# Patient Record
Sex: Female | Born: 1950 | Race: Black or African American | Hispanic: No | Marital: Married | Smoking: Never smoker
Health system: Southern US, Community
[De-identification: ages and names within clinical notes are randomized; demographics above are authoritative.]

## PROBLEM LIST (undated history)

## (undated) DIAGNOSIS — I10 Essential (primary) hypertension: Secondary | ICD-10-CM

## (undated) DIAGNOSIS — R7302 Impaired glucose tolerance (oral): Secondary | ICD-10-CM

## (undated) DIAGNOSIS — E785 Hyperlipidemia, unspecified: Secondary | ICD-10-CM

## (undated) DIAGNOSIS — K219 Gastro-esophageal reflux disease without esophagitis: Secondary | ICD-10-CM

## (undated) DIAGNOSIS — E119 Type 2 diabetes mellitus without complications: Secondary | ICD-10-CM

## (undated) DIAGNOSIS — E7800 Pure hypercholesterolemia, unspecified: Secondary | ICD-10-CM

## (undated) DIAGNOSIS — M810 Age-related osteoporosis without current pathological fracture: Secondary | ICD-10-CM

## (undated) HISTORY — DX: Essential (primary) hypertension: I10

## (undated) HISTORY — DX: Type 2 diabetes mellitus without complications: E11.9

## (undated) HISTORY — DX: Age-related osteoporosis without current pathological fracture: M81.0

## (undated) HISTORY — DX: Impaired glucose tolerance (oral): R73.02

## (undated) HISTORY — PX: BREAST EXCISIONAL BIOPSY: SUR124

## (undated) HISTORY — PX: BREAST SURGERY: SHX581

## (undated) HISTORY — DX: Hyperlipidemia, unspecified: E78.5

## (undated) HISTORY — PX: ABDOMINAL HYSTERECTOMY: SHX81

## (undated) HISTORY — PX: CYSTECTOMY: SUR359

---

## 2000-06-29 ENCOUNTER — Encounter: Payer: Self-pay | Admitting: Family Medicine

## 2000-06-29 ENCOUNTER — Encounter: Admission: RE | Admit: 2000-06-29 | Discharge: 2000-06-29 | Payer: Self-pay | Admitting: Family Medicine

## 2002-11-14 ENCOUNTER — Encounter: Admission: RE | Admit: 2002-11-14 | Discharge: 2002-11-14 | Payer: Self-pay | Admitting: Internal Medicine

## 2002-11-14 ENCOUNTER — Encounter: Payer: Self-pay | Admitting: Internal Medicine

## 2003-07-14 ENCOUNTER — Encounter: Admission: RE | Admit: 2003-07-14 | Discharge: 2003-07-14 | Payer: Self-pay | Admitting: Internal Medicine

## 2003-07-14 ENCOUNTER — Encounter: Payer: Self-pay | Admitting: Internal Medicine

## 2004-11-02 ENCOUNTER — Encounter: Admission: RE | Admit: 2004-11-02 | Discharge: 2004-11-02 | Payer: Self-pay | Admitting: Internal Medicine

## 2005-03-10 ENCOUNTER — Ambulatory Visit: Payer: Self-pay

## 2006-02-22 ENCOUNTER — Encounter: Admission: RE | Admit: 2006-02-22 | Discharge: 2006-02-22 | Payer: Self-pay | Admitting: Internal Medicine

## 2006-02-22 IMAGING — MG MM SCREEN MAMMOGRAM BILATERAL
5 series · 5 of 5 positions shown · non-contrast
Comparison: none

DG SCREEN MAMMOGRAM BILATERAL
Bilateral CC and MLO view(s) were taken.

SCREENING MAMMOGRAM:
There is a  dense fibroglandular pattern.  No masses or malignant type calcifications are 
identified.  Compared with prior studies.

[R CC]
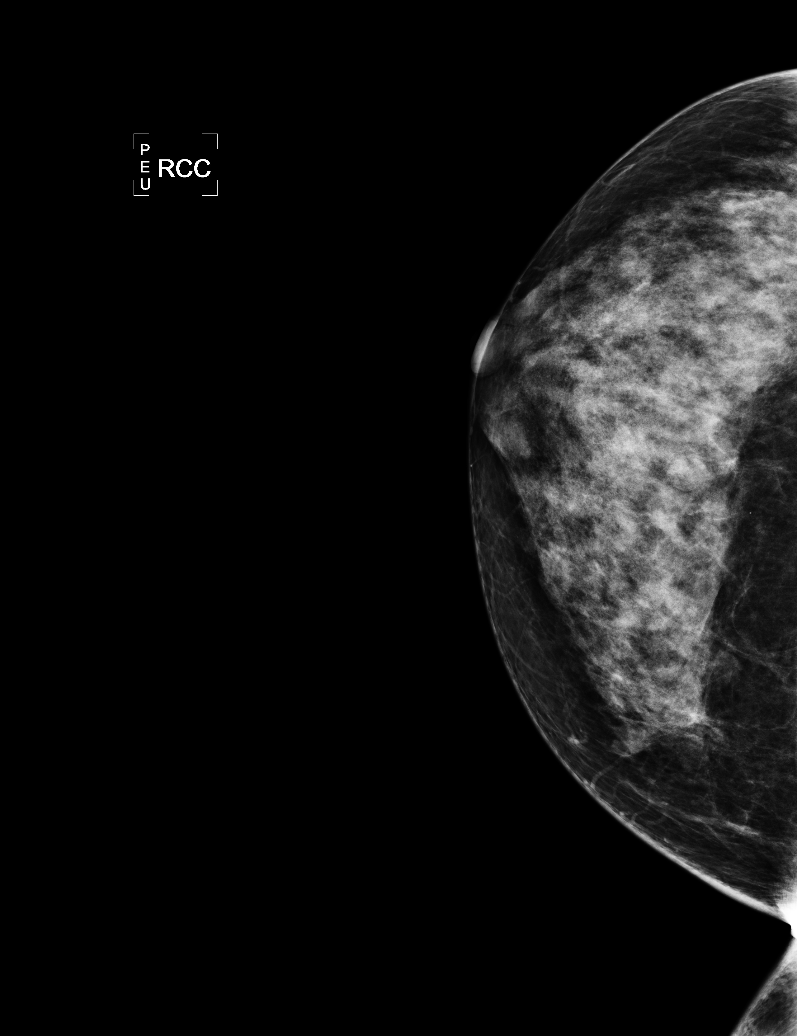

[L CC]
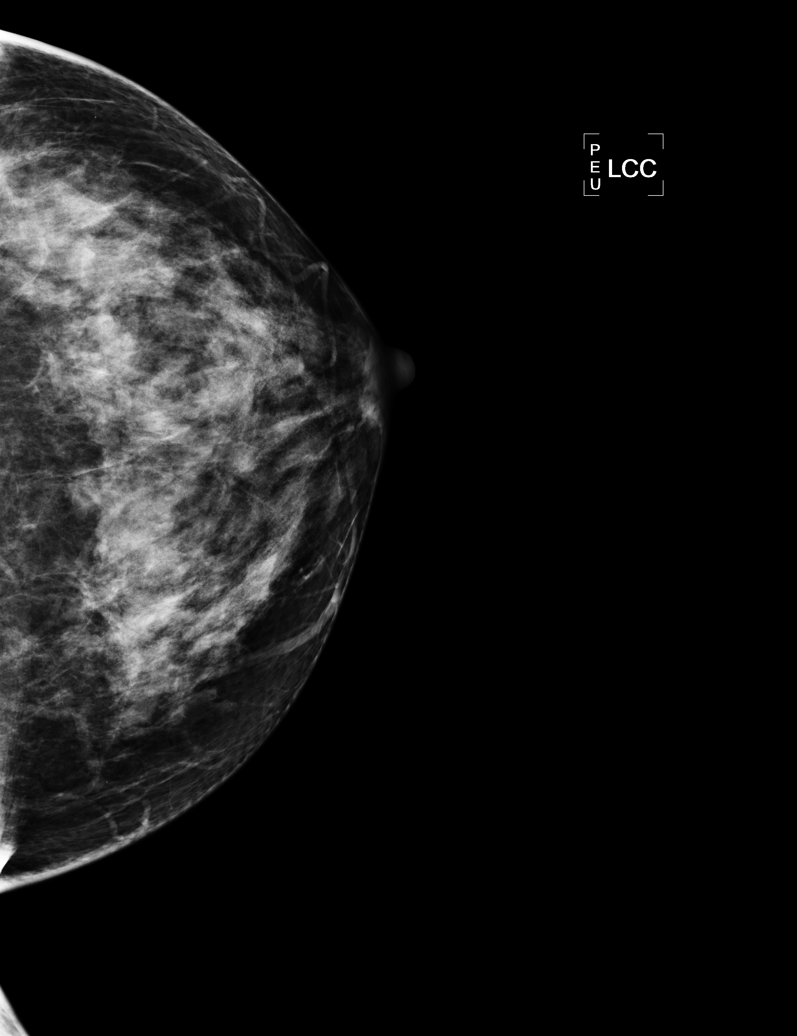

[L MLO]
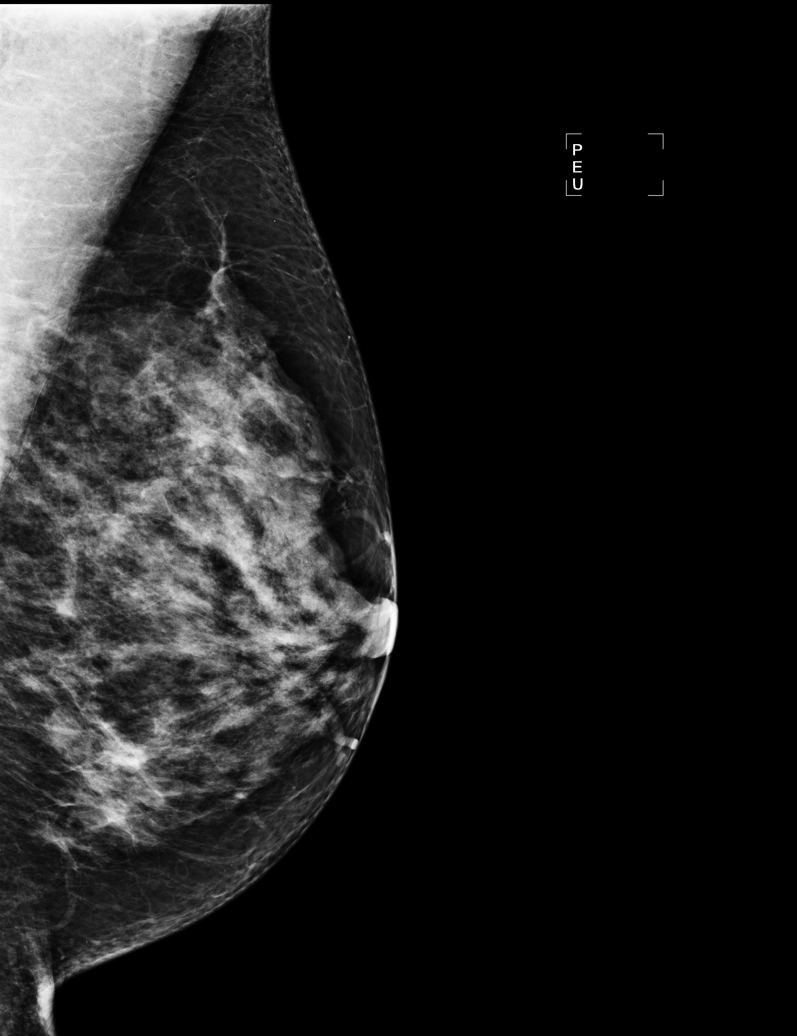

[R MLO]
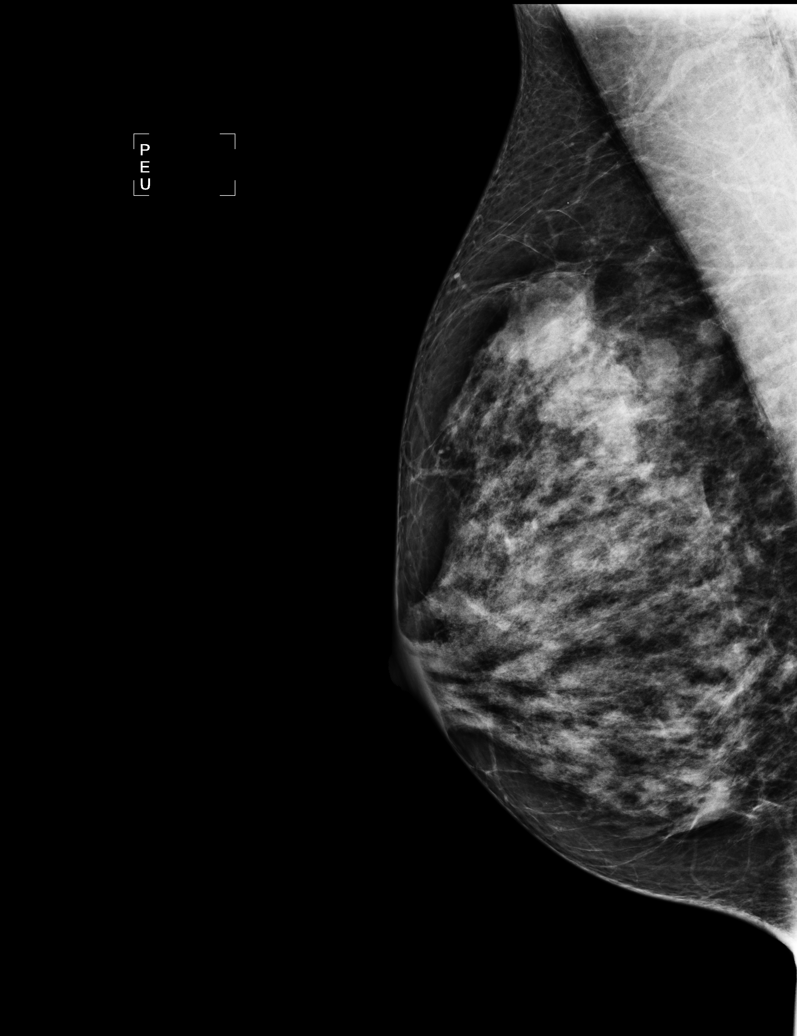

[R XCCL]
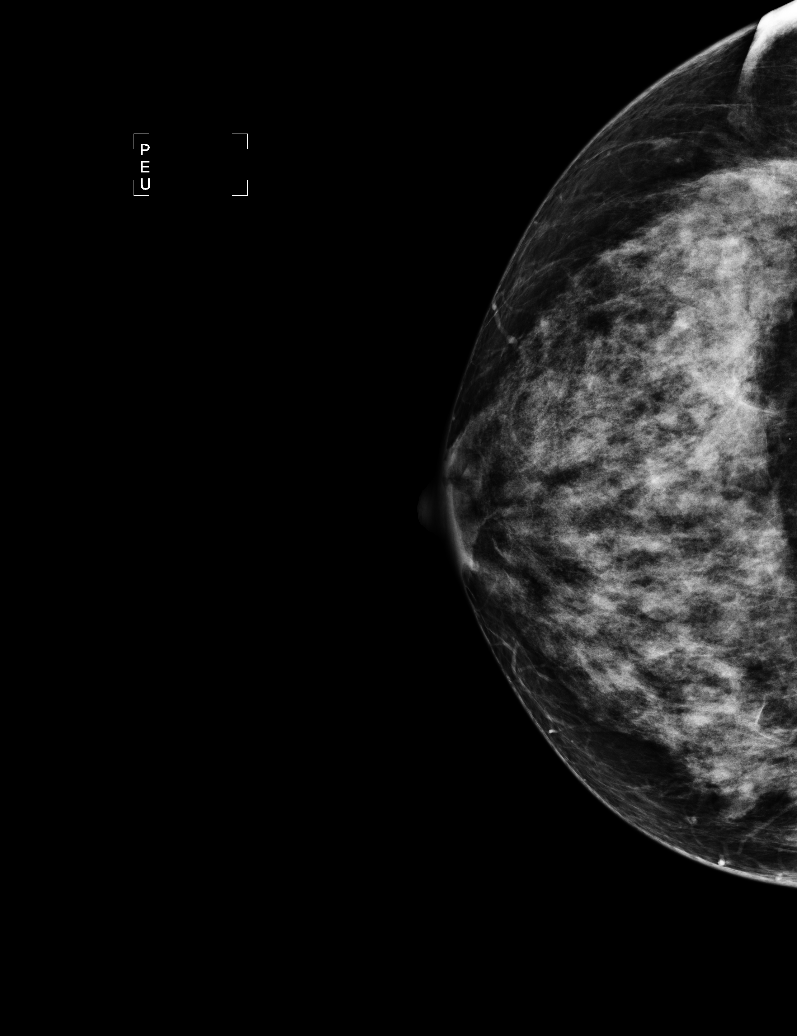

[5 of 5 positions shown; findings below may reference images not displayed]

IMPRESSION: No specific mammographic evidence of malignancy.  Next screening mammogram is recommended in one 
year.

ASSESSMENT: Negative - BI-RADS 1

Screening mammogram in 1 year.
ANALYZED BY COMPUTER AIDED DETECTION. , THIS PROCEDURE WAS A DIGITAL MAMMOGRAM.

## 2007-03-06 ENCOUNTER — Encounter: Admission: RE | Admit: 2007-03-06 | Discharge: 2007-03-06 | Payer: Self-pay | Admitting: Internal Medicine

## 2007-03-06 IMAGING — MG MM SCREEN MAMMOGRAM BILATERAL
6 series · 6 of 6 positions shown · non-contrast
Comparison: none

DG SCREEN MAMMOGRAM BILATERAL
Bilateral CC and MLO view(s) were taken.

DIGITAL SCREENING MAMMOGRAM WITH CAD:
There is a fibroglandular pattern.  A possible mass is noted in the right breast.  Spot compression
views and possibly sonography are recommended for further evaluation.  In the left breast, no 
masses or malignant type calcifications are identified.  Compared with prior studies.

[R CC (1 of 2)]
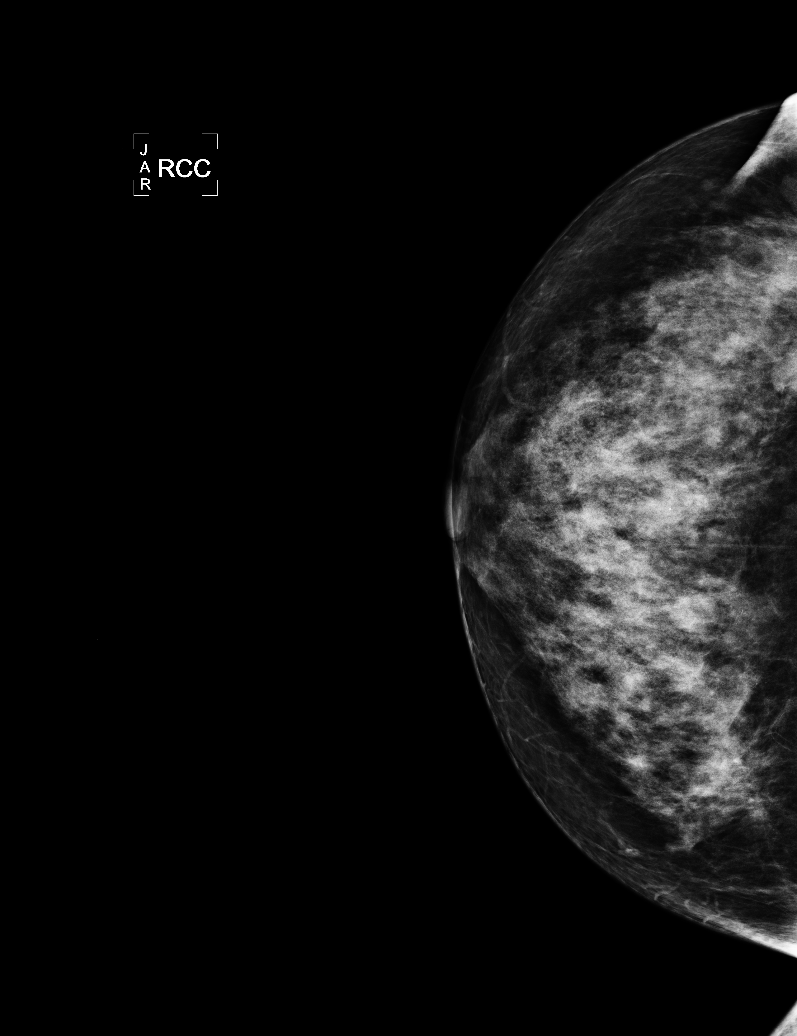

[L CC]
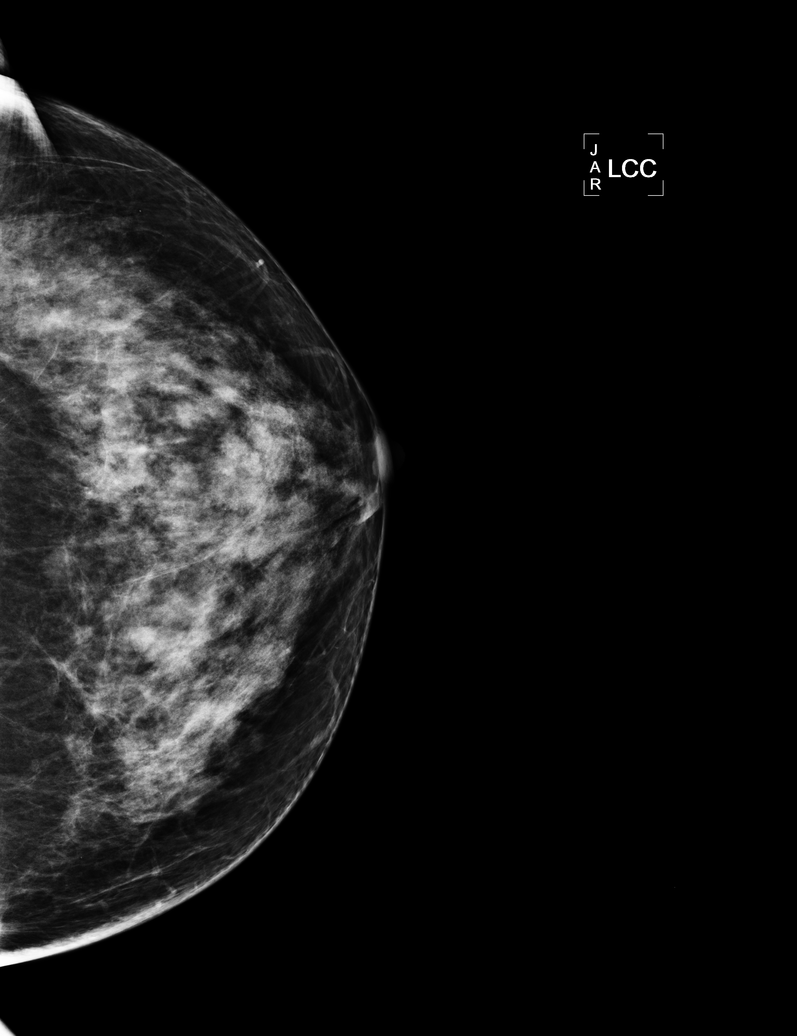

[L MLO]
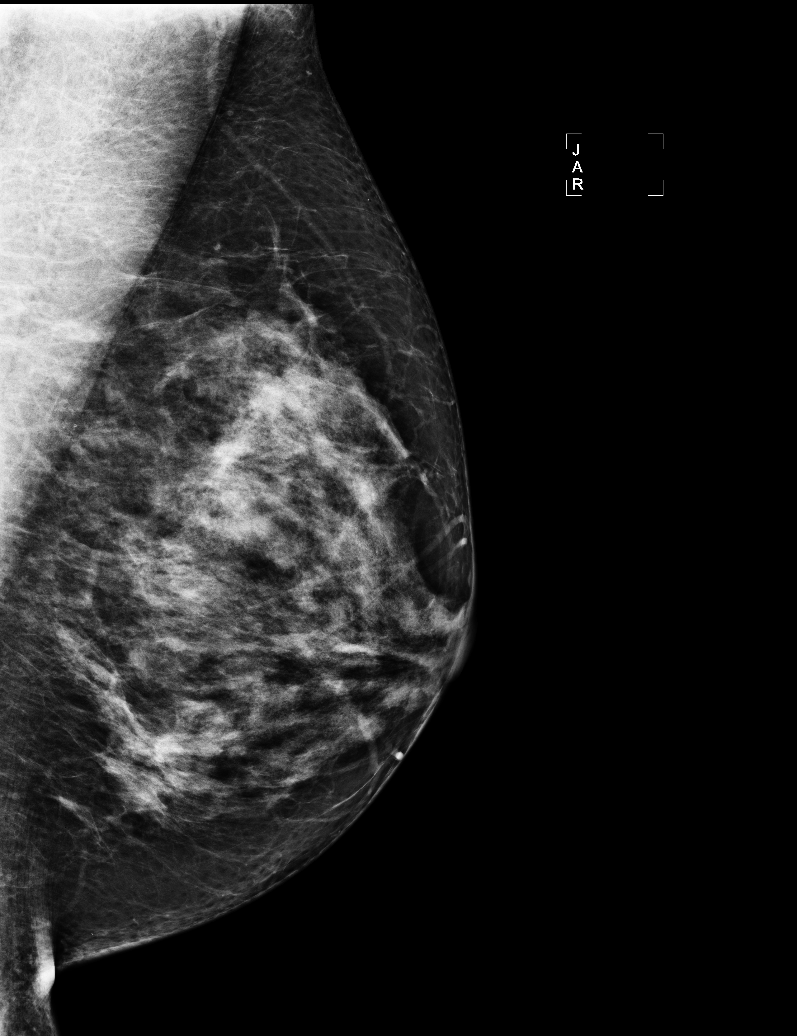

[R MLO (1 of 2)]
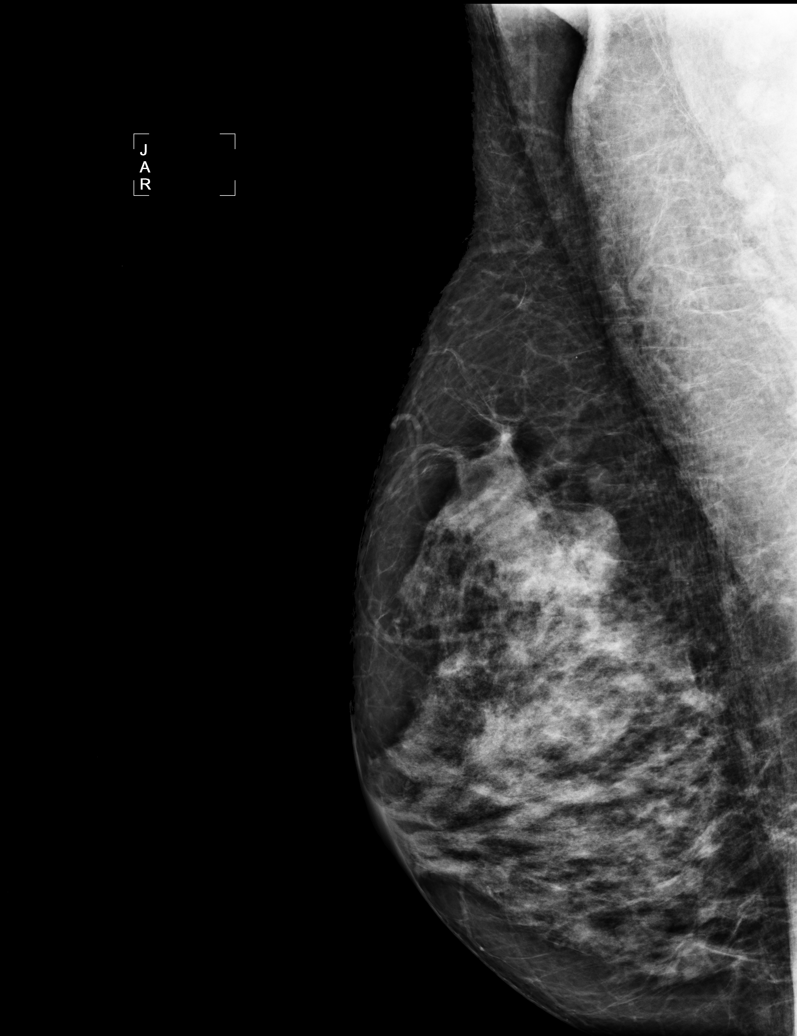

[R MLO (2 of 2)]
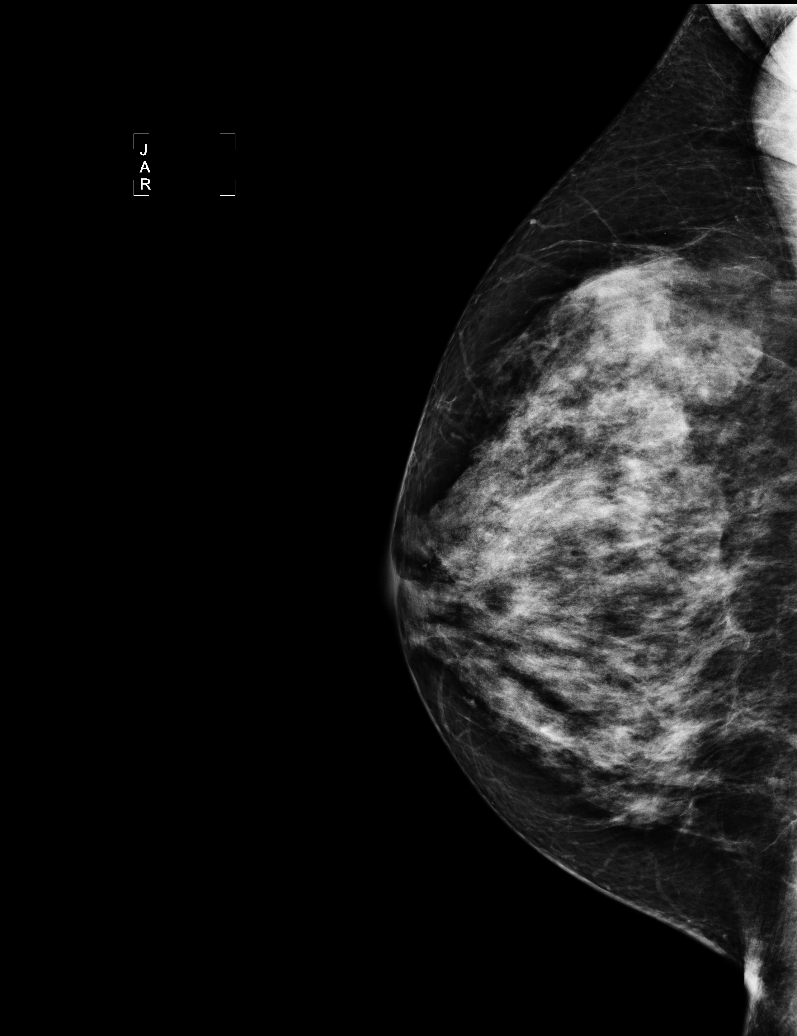

[R CC (2 of 2)]
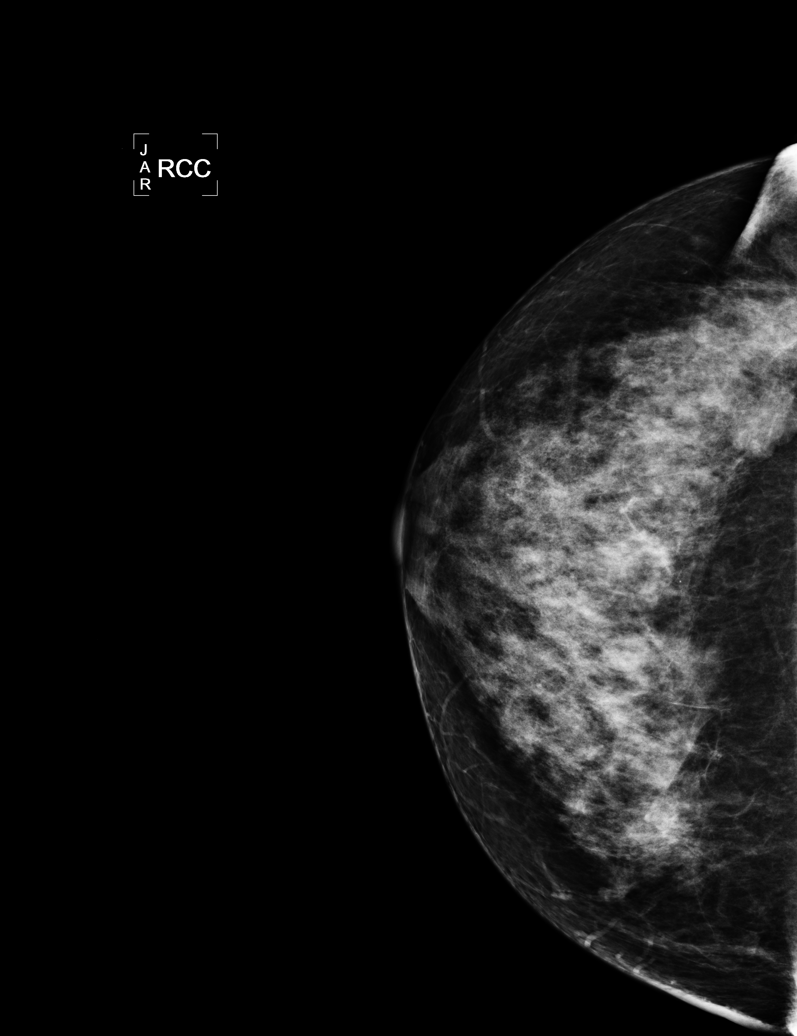

[6 of 6 positions shown; findings below may reference images not displayed]

IMPRESSION: Possible mass, right breast.  Additional evaluation is indicated.  The patient will be contacted 
for additional studies and a supplementary report will follow.  No specific mammographic evidence 
of malignancy, left breast.

ASSESSMENT: Need additional imaging evaluation and/or prior mammograms for comparison - BI-RADS 0

Further imaging of the right breast.
ANALYZED BY COMPUTER AIDED DETECTION. , THIS PROCEDURE WAS A DIGITAL MAMMOGRAM.

## 2007-03-21 ENCOUNTER — Encounter: Admission: RE | Admit: 2007-03-21 | Discharge: 2007-03-21 | Payer: Self-pay | Admitting: Internal Medicine

## 2007-03-21 IMAGING — MG MM DIAGNOSTIC LTD RIGHT
4 series · 4 of 4 positions shown · non-contrast
Comparison: none

[REDACTED] RIGHT
CC and MLO view(s) were taken of the right breast.
Technologist: LUITEL

RIGHT BREAST ULTRASOUND
Technologist: LUITEL, Medical
DIGITAL LIMITED RIGHT DIAGNOSTIC MAMMOGRAM WITH CAD AND RIGHT BREAST ULTRASOUND:
CLINICAL DATA: Patient returns for evaluation of a possible mass in the right breast noted on 
recent screening study dated [DATE].

[R CC (1 of 2)]
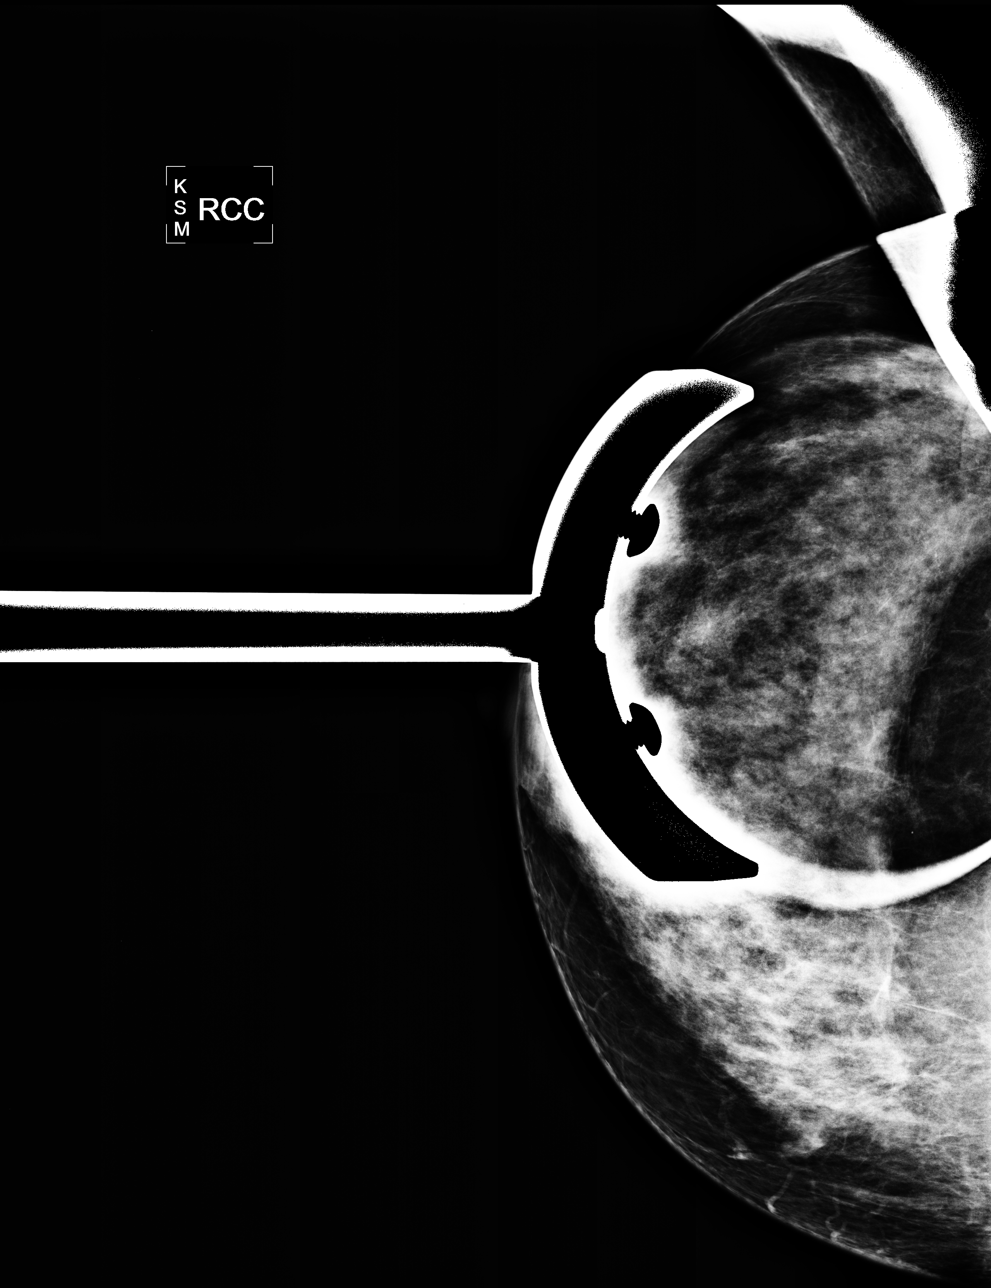

[R ML (1 of 2)]
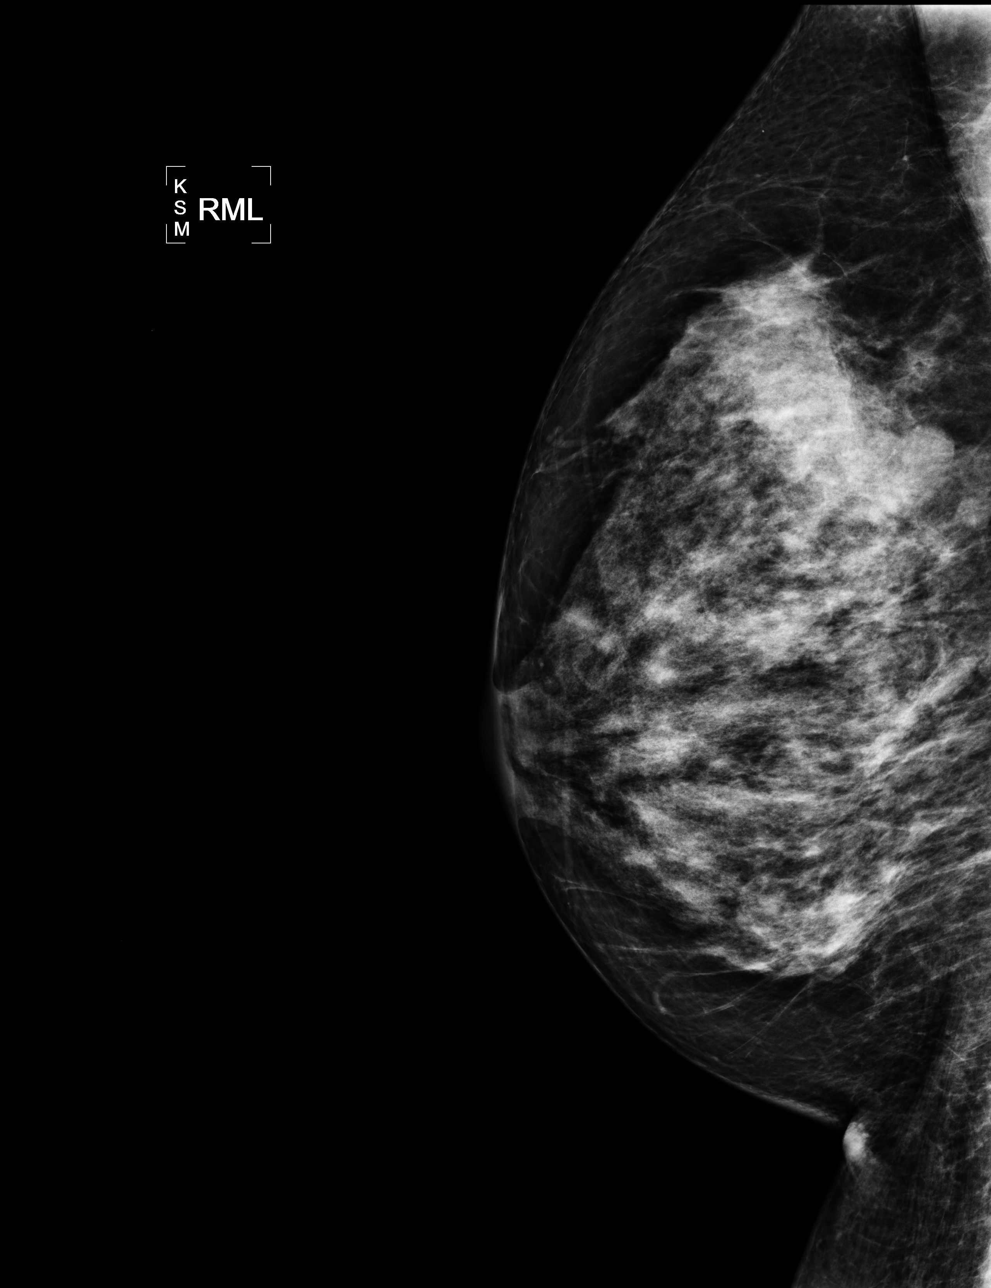

[R CC (2 of 2)]
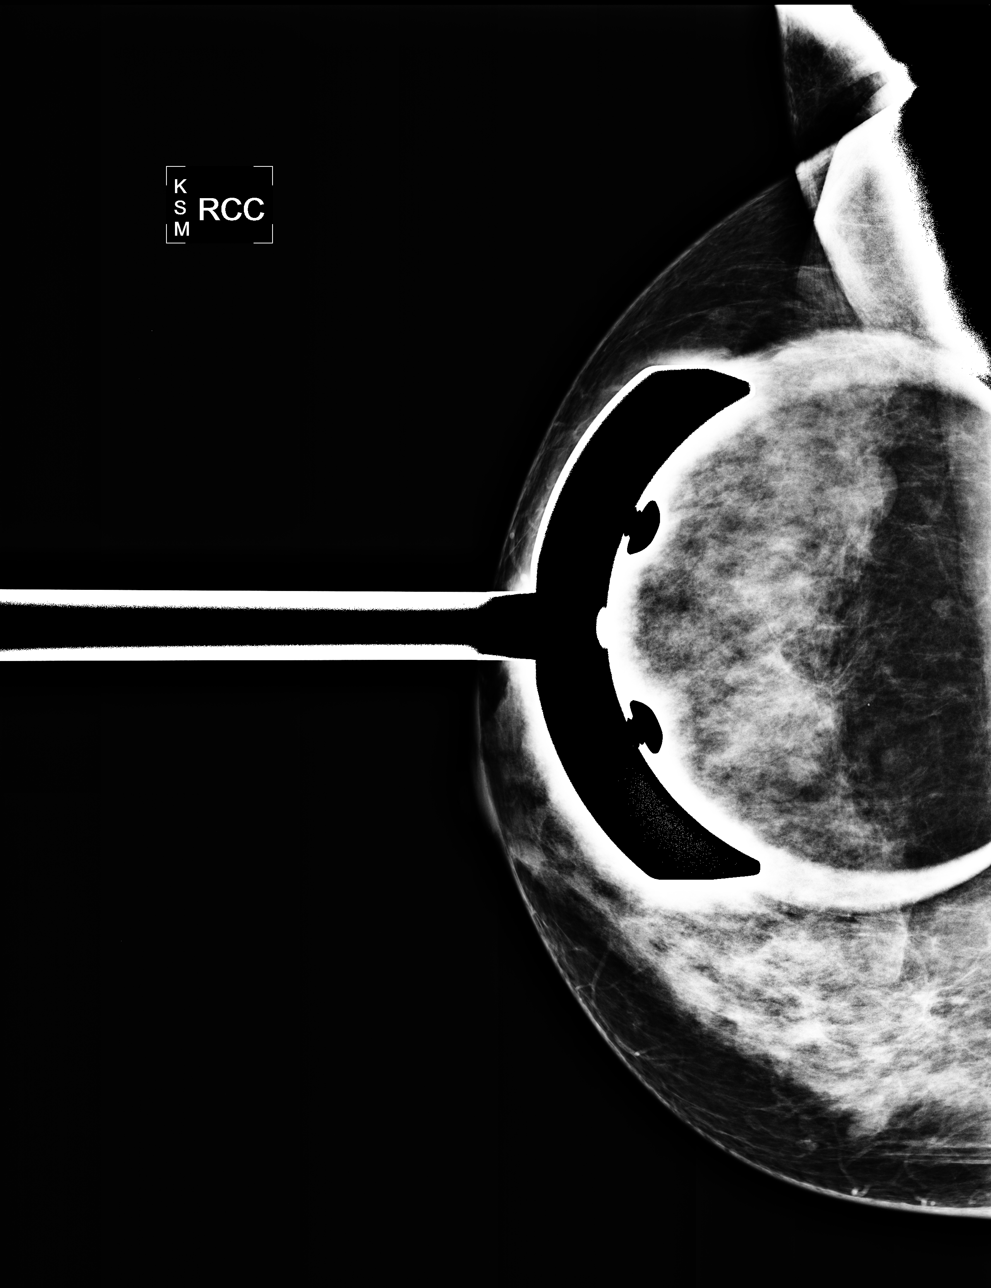

[R ML (2 of 2)]
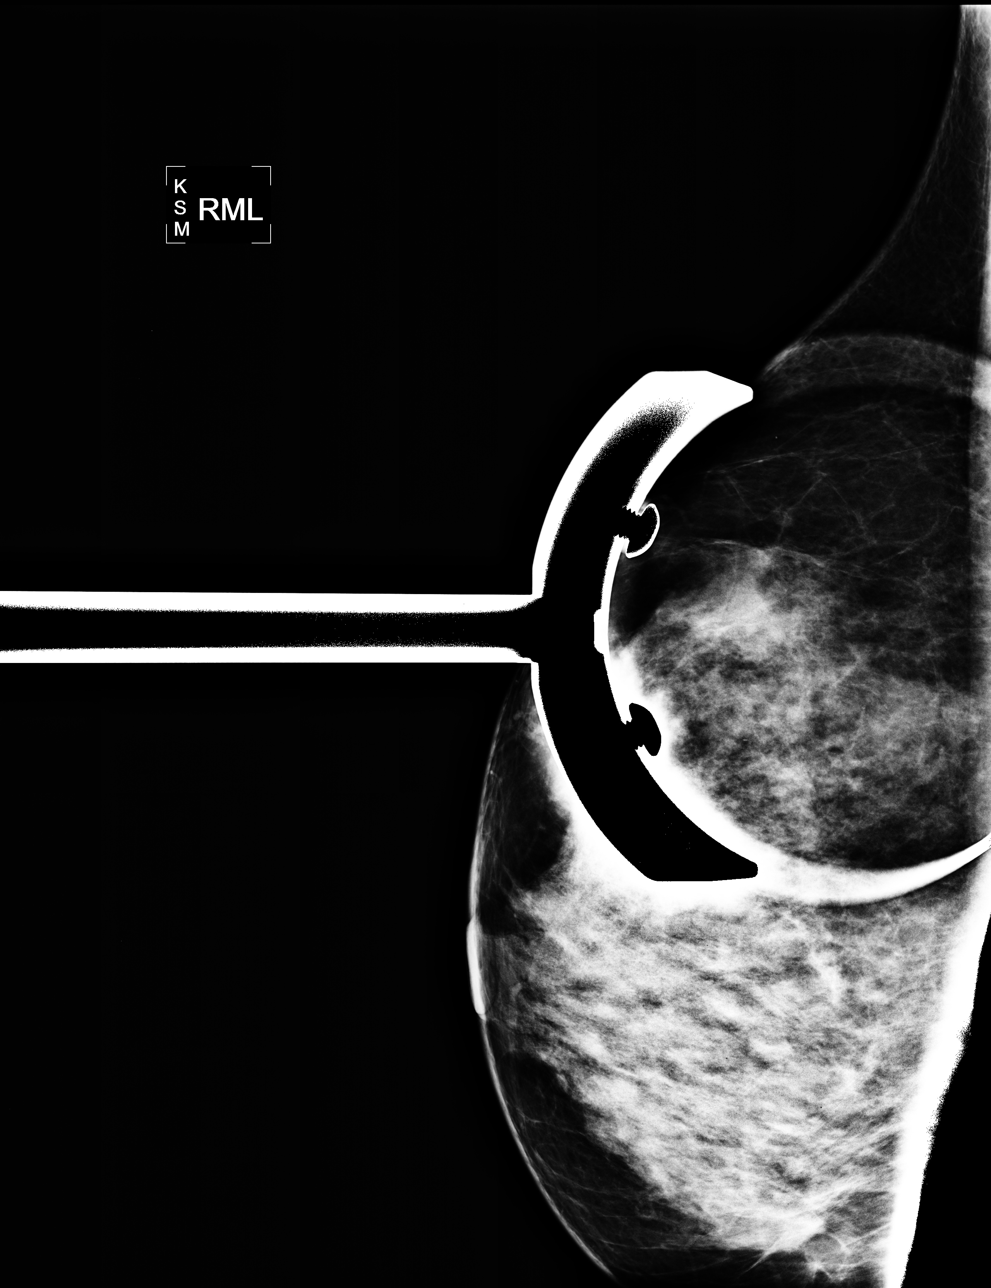

[4 of 4 positions shown; findings below may reference images not displayed]

Comparison is made to prior film screen study dated [DATE].  The breast tissue is heterogeneously 
dense.  Spot compression views confirm the presence of an oval slightly lobulated nodule in the 
right upper outer quadrant posteriorly.  With the benefit of retrospection, this was felt to have 
been present on prior study in [21] but better visualized currently due to the digital technique.

On physical examination, no mass is palpated in the right upper outer quadrant.  Sonography 
demonstrates an oval hypoechoic nodule at 10 o'clock 7 cm from the right nipple measuring 1.6 x
x 1.1 cm.  This is felt likely to represent a benign fibroadenoma.  Given that this was felt to be
present on prior exam and is likely unchanged, six month follow-up sonography was suggested to the
patient.  She agreed with this plan.  She was given options of surgical excisional biopsy and 
ultrasound-guided core needle biopsy as well.
IMPRESSION: Probably benign fibroadenoma in the right breast at 10 o'clock 7 cm from the right nipple.  Six 
month follow-up sonography is suggested.  If findings are stable at that point, yearly screening 
can be reinstituted.

ASSESSMENT: Probably benign - BI-RADS 3

Ultrasound of the right breast in 6 months.
ANALYZED BY COMPUTER AIDED DETECTION. ,

## 2007-03-21 IMAGING — US UNKNOWN US STUDY
1 series · 4 of 4 positions shown · non-contrast
Comparison: none

[REDACTED] RIGHT
CC and MLO view(s) were taken of the right breast.
Technologist: LUITEL

RIGHT BREAST ULTRASOUND
Technologist: LUITEL, Medical
DIGITAL LIMITED RIGHT DIAGNOSTIC MAMMOGRAM WITH CAD AND RIGHT BREAST ULTRASOUND:
CLINICAL DATA: Patient returns for evaluation of a possible mass in the right breast noted on 
recent screening study dated [DATE].

[Series 1: unknown us study · 4 of 4 slices shown]
[im 1/4]
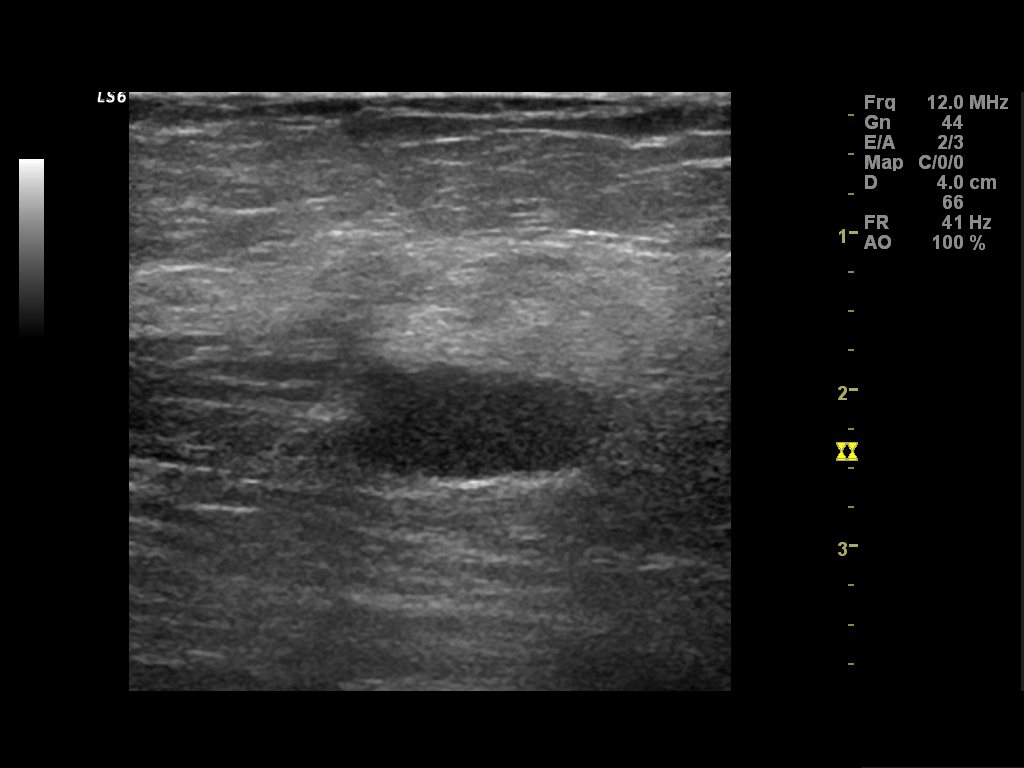
[im 2/4]
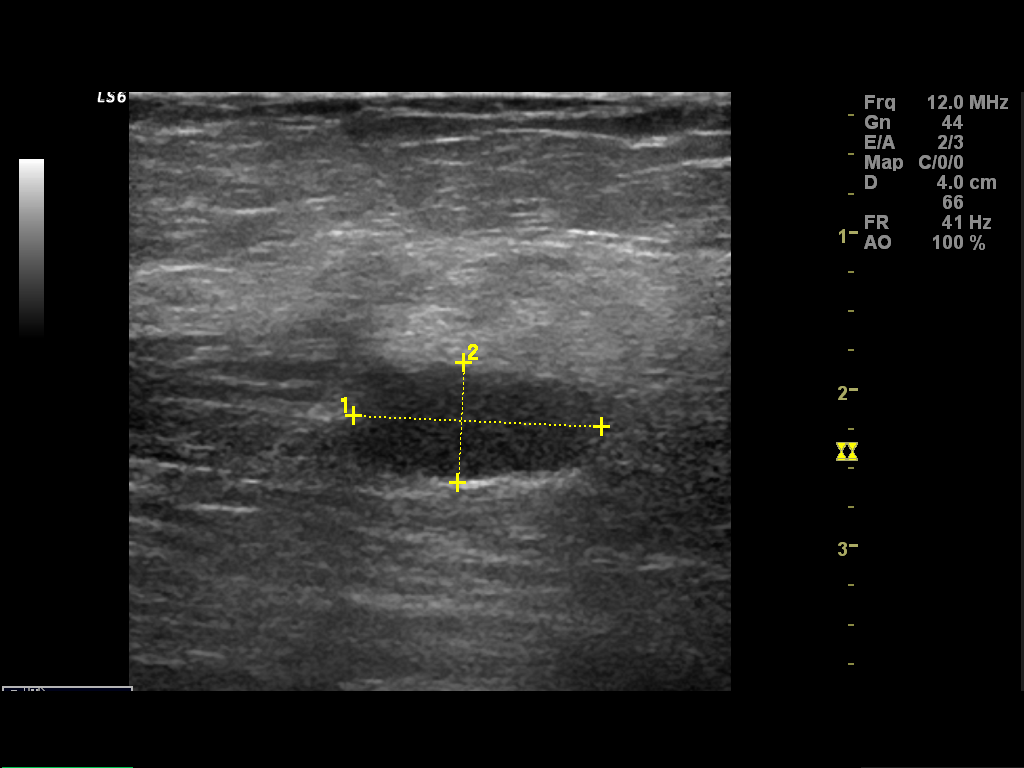
[im 3/4]
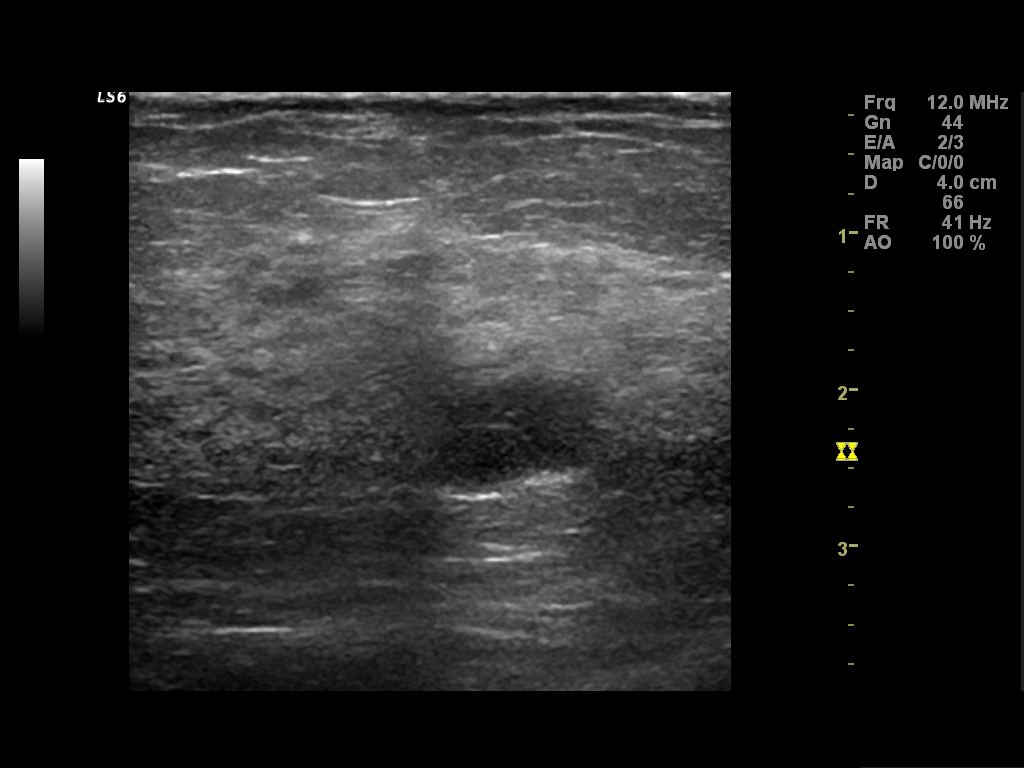
[im 4/4]
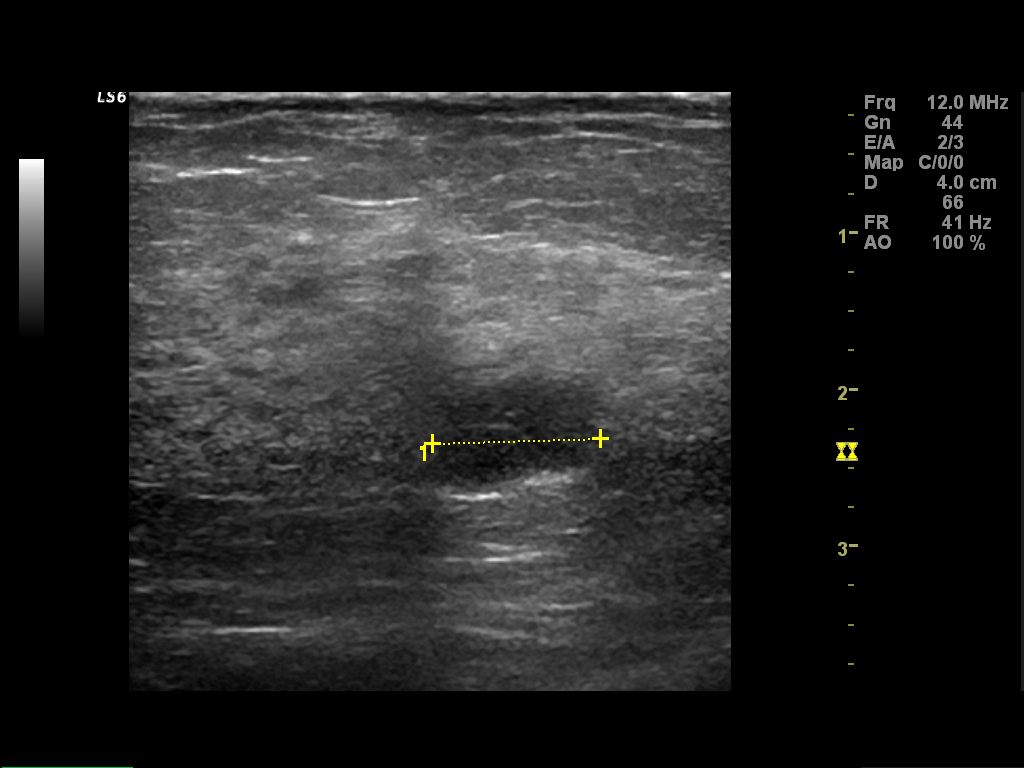

[4 of 4 positions shown; findings below may reference images not displayed]

Comparison is made to prior film screen study dated [DATE].  The breast tissue is heterogeneously 
dense.  Spot compression views confirm the presence of an oval slightly lobulated nodule in the 
right upper outer quadrant posteriorly.  With the benefit of retrospection, this was felt to have 
been present on prior study in [21] but better visualized currently due to the digital technique.

On physical examination, no mass is palpated in the right upper outer quadrant.  Sonography 
demonstrates an oval hypoechoic nodule at 10 o'clock 7 cm from the right nipple measuring 1.6 x
x 1.1 cm.  This is felt likely to represent a benign fibroadenoma.  Given that this was felt to be
present on prior exam and is likely unchanged, six month follow-up sonography was suggested to the
patient.  She agreed with this plan.  She was given options of surgical excisional biopsy and 
ultrasound-guided core needle biopsy as well.
IMPRESSION: Probably benign fibroadenoma in the right breast at 10 o'clock 7 cm from the right nipple.  Six 
month follow-up sonography is suggested.  If findings are stable at that point, yearly screening 
can be reinstituted.

ASSESSMENT: Probably benign - BI-RADS 3

Ultrasound of the right breast in 6 months.
ANALYZED BY COMPUTER AIDED DETECTION. ,

## 2007-09-17 ENCOUNTER — Encounter: Admission: RE | Admit: 2007-09-17 | Discharge: 2007-09-17 | Payer: Self-pay | Admitting: Internal Medicine

## 2007-09-17 IMAGING — US BREAST
1 series · 4 of 4 positions shown · non-contrast
Comparison: none

RIGHT BREAST ULTRASOUND

RIGHT BREAST ULTRASOUND:
CLINICAL DATA: Patient returns for six month follow-up of a probably benign fibroadenoma in the 
right upper outer quadrant.

[Series 1: breast · 4 of 4 slices shown]
[im 1/4]
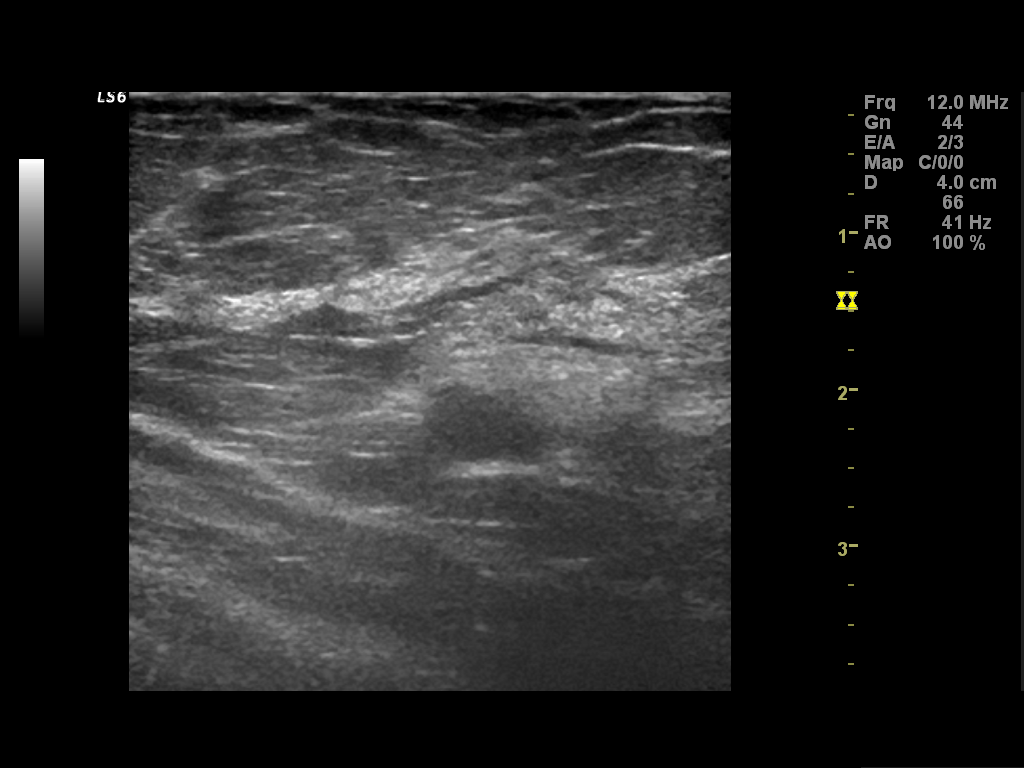
[im 2/4]
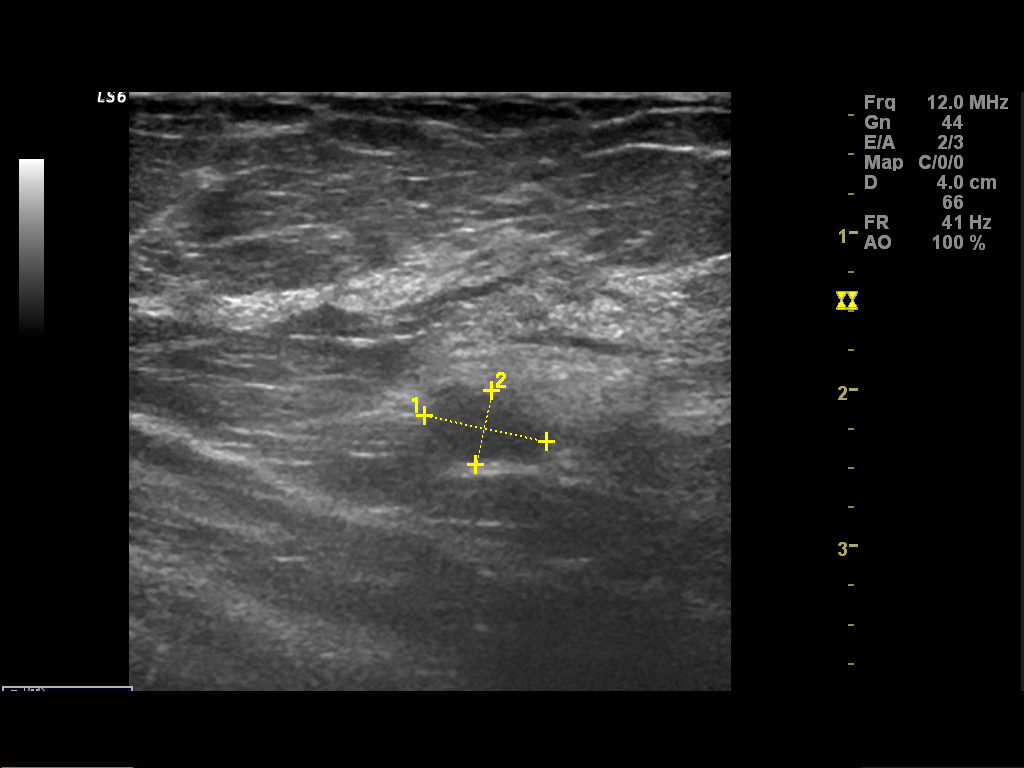
[im 3/4]
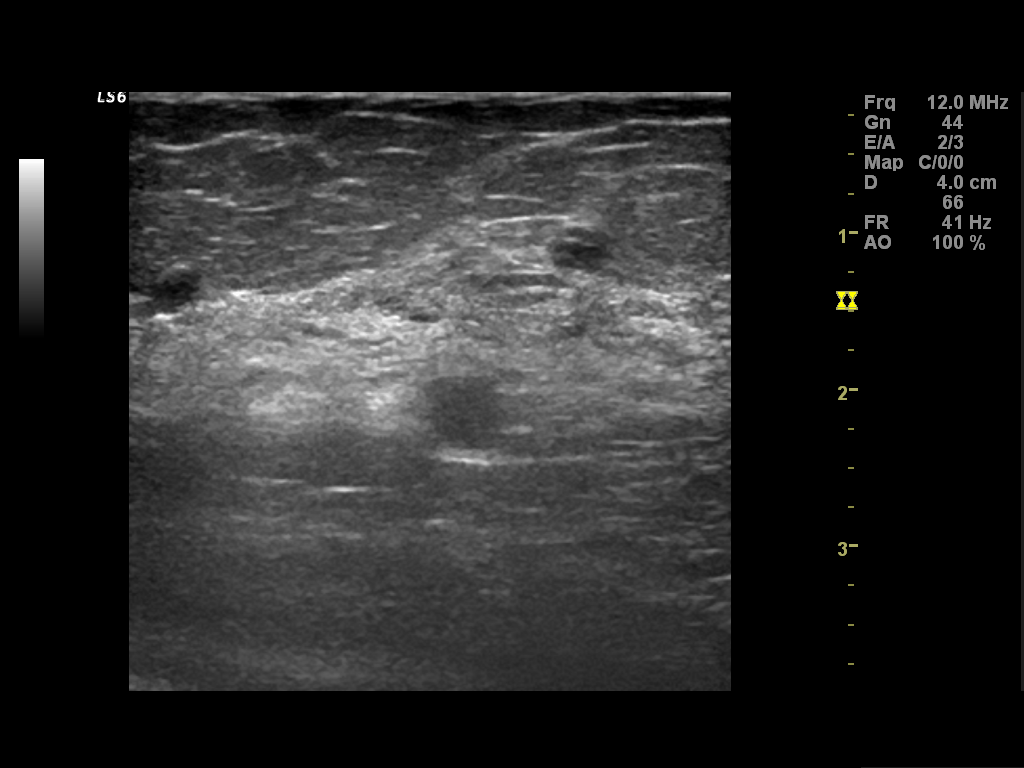
[im 4/4]
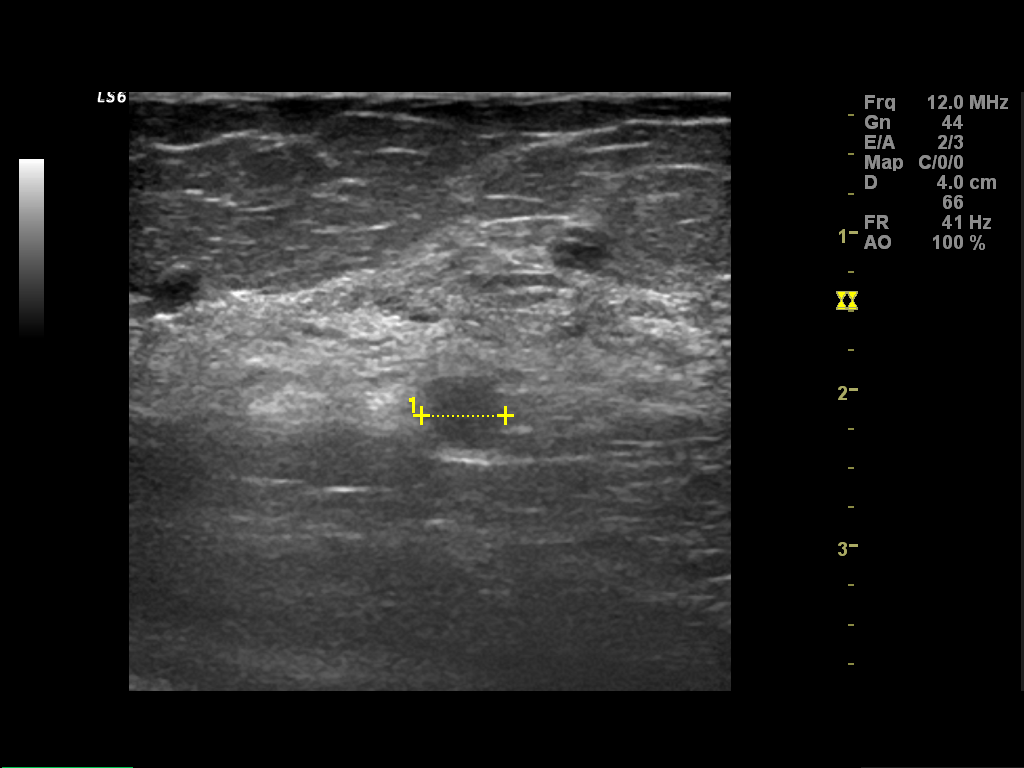

[4 of 4 positions shown; findings below may reference images not displayed]

On physical examination, no mass is palpated in the right upper outer quadrant.  Sonography 
demonstrates a hypoechoic nodule at 10 o'clock 7 cm from the right nipple that has decreased in 
size since the prior study, now measuring 8 x 5 x 5 mm.  This is therefore consistent with a cyst 
given the decrease in size.  No worrisome solid mass is identified.
IMPRESSION: Interval decrease in size of the nodule in the right upper outer quadrant which would be consistent
with a cyst.  No sonographic evidence of malignancy. Yearly screening is now suggested with next 
scheduled exam in [DATE].

ASSESSMENT: Benign - BI-RADS 2

Screening mammogram of both breasts in 6 months.
,

## 2008-03-06 ENCOUNTER — Encounter: Admission: RE | Admit: 2008-03-06 | Discharge: 2008-03-06 | Payer: Self-pay | Admitting: Internal Medicine

## 2008-03-06 IMAGING — MG MM SCREEN MAMMOGRAM BILATERAL
4 series · 4 of 4 positions shown · non-contrast
Comparison: none

DG SCREEN MAMMOGRAM BILATERAL
Bilateral CC and MLO view(s) were taken.

DIGITAL SCREENING MAMMOGRAM WITH CAD:
The breast tissue is heterogeneously dense.  No masses or malignant type calcifications are 
identified.  Compared with prior studies.

[R CC]
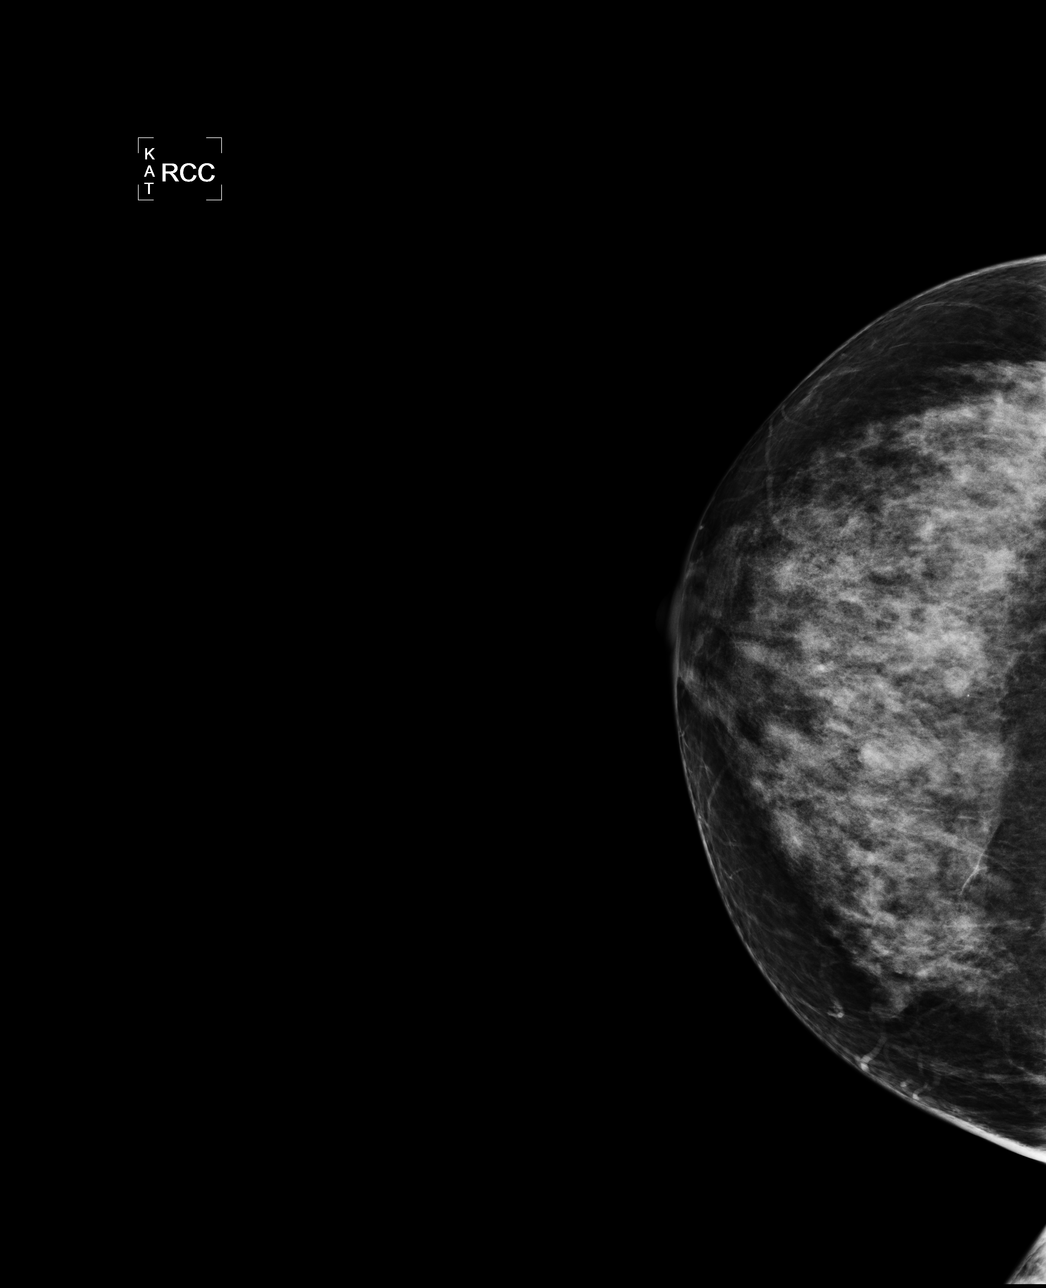

[L CC]
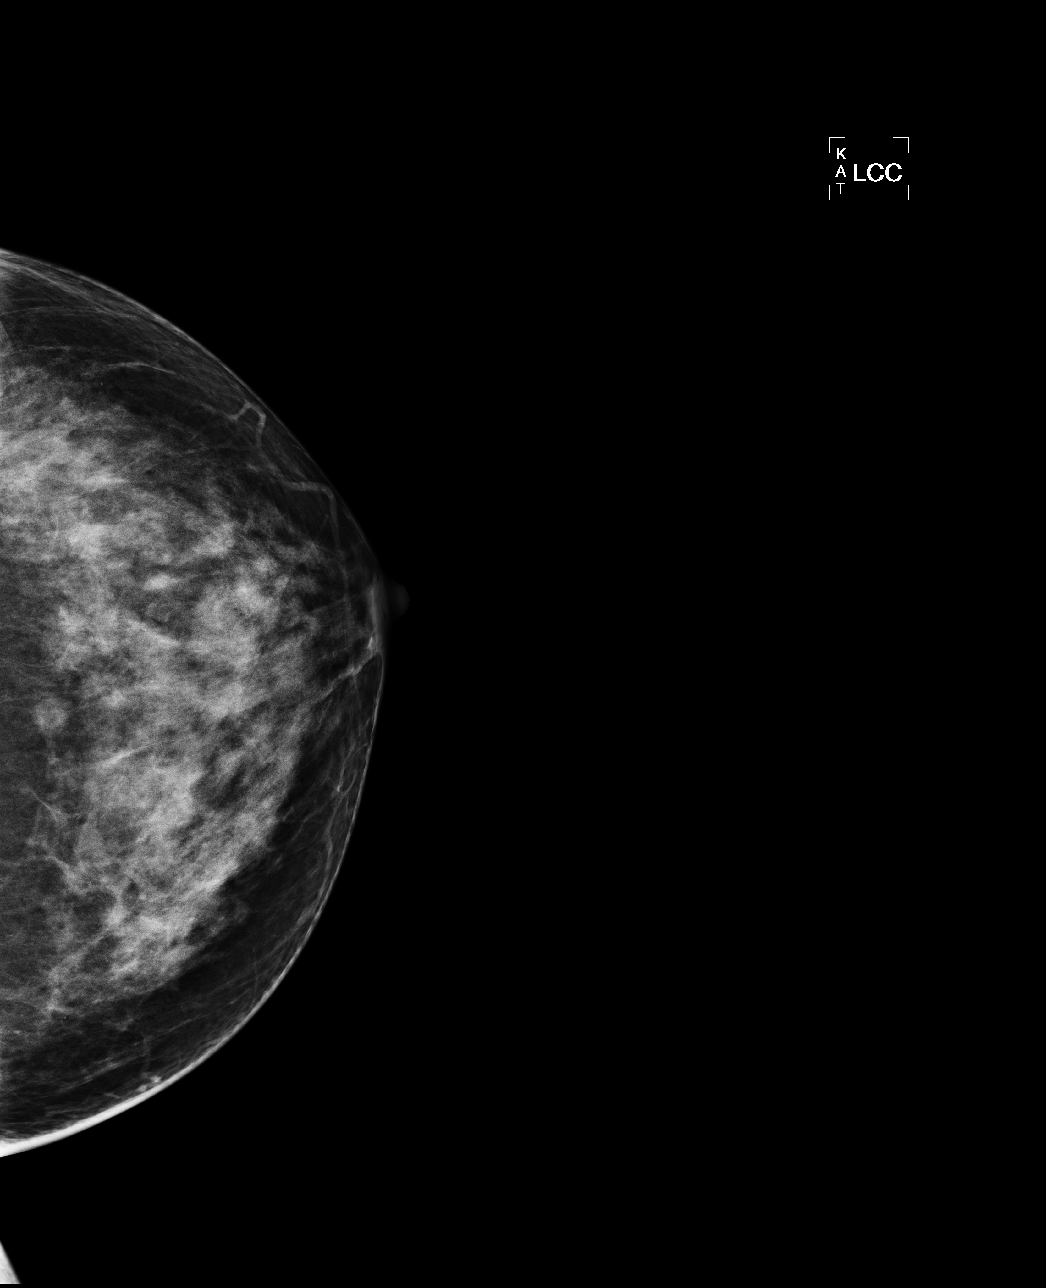

[L MLO]
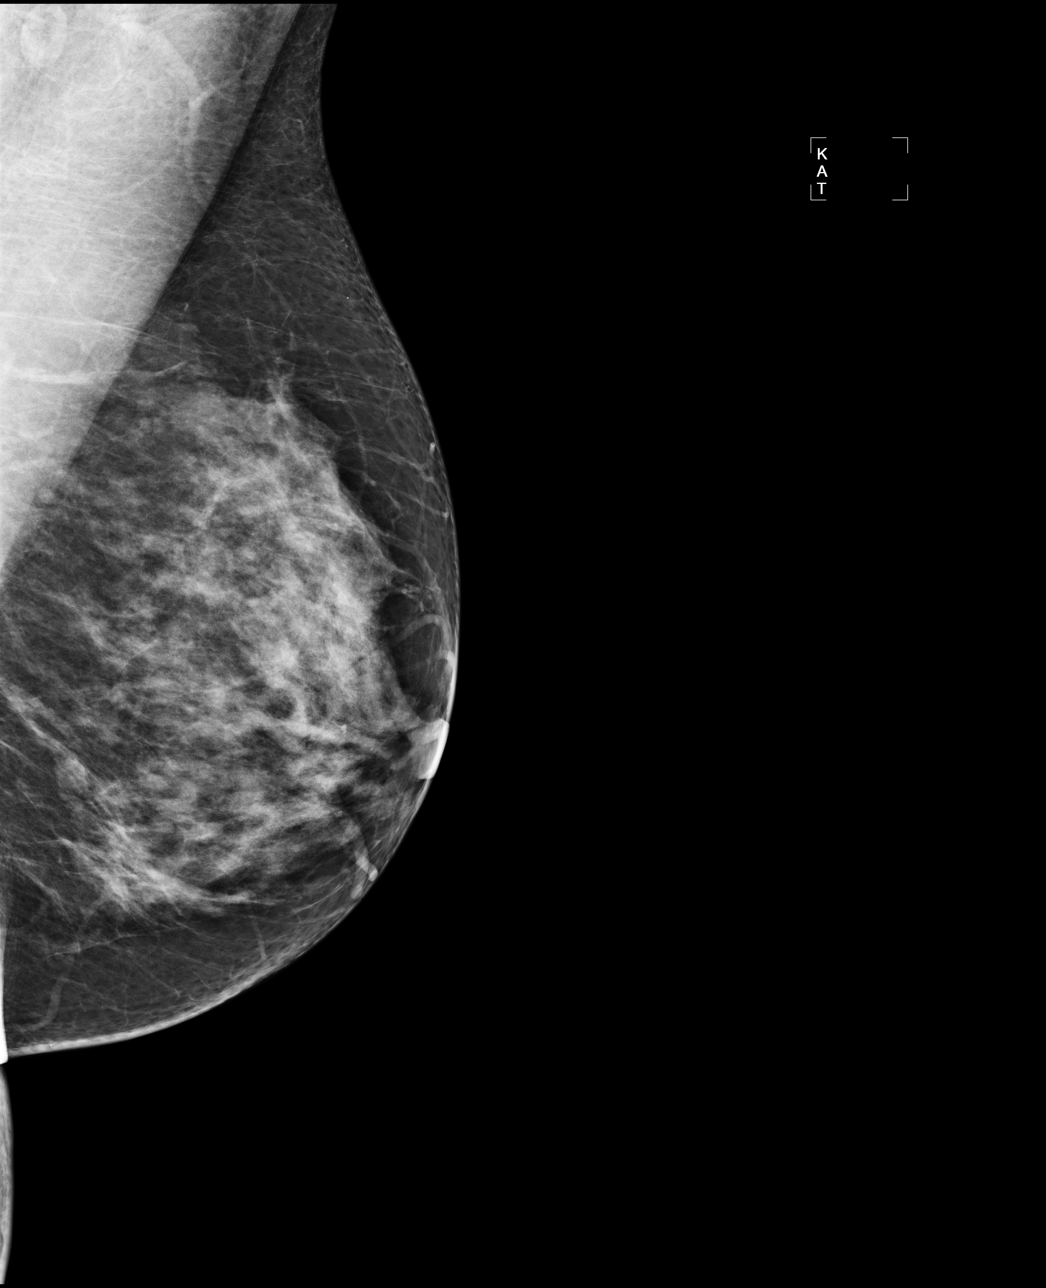

[R MLO]
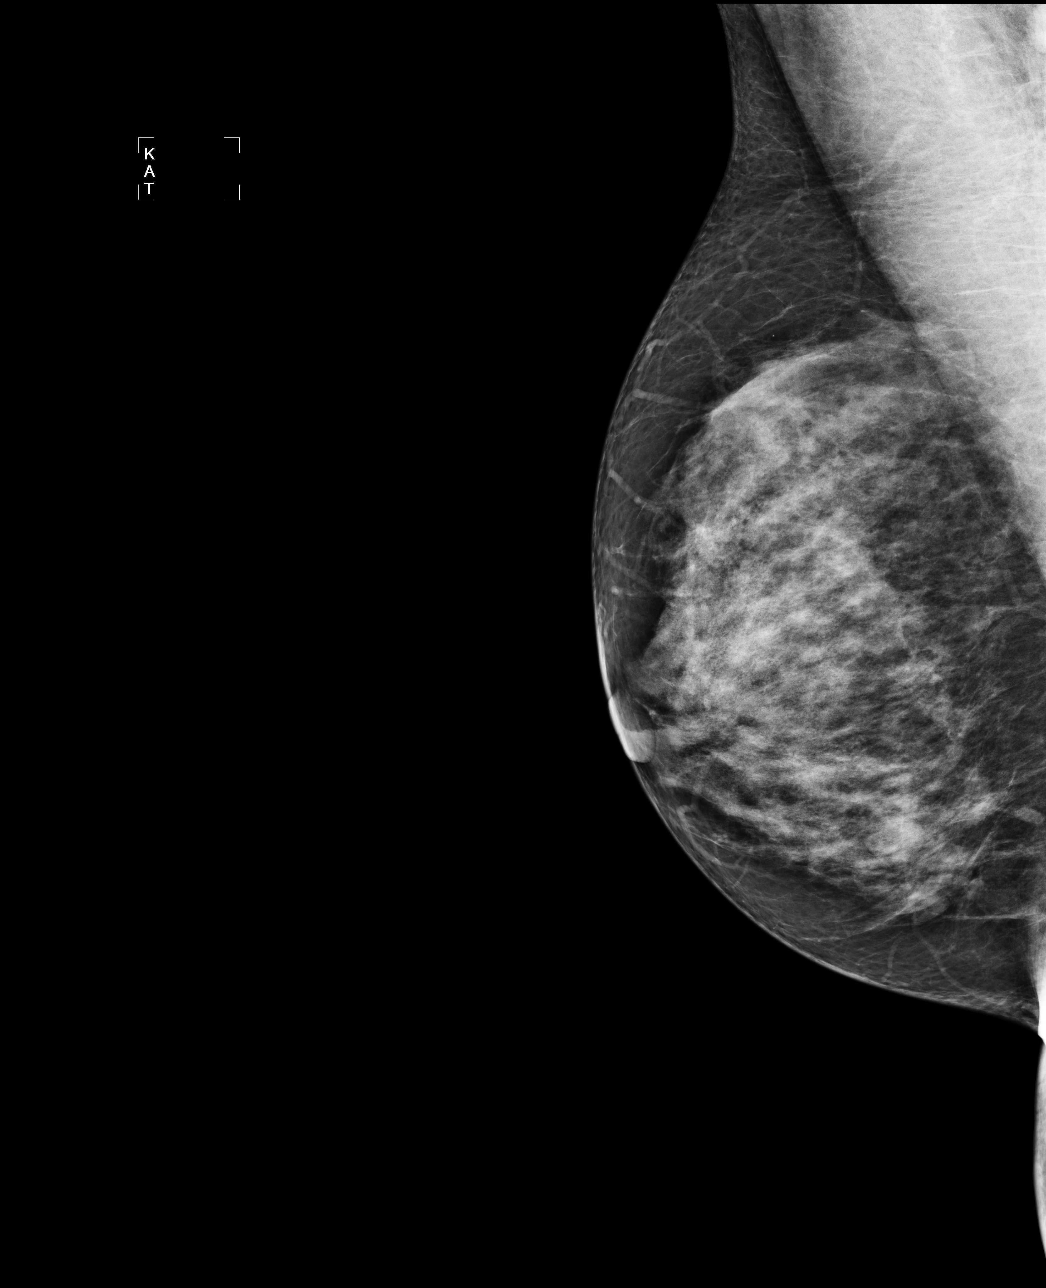

[4 of 4 positions shown; findings below may reference images not displayed]

IMPRESSION: No specific mammographic evidence of malignancy.  Next screening mammogram is recommended in one 
year.

ASSESSMENT: Negative - BI-RADS 1

Screening mammogram in 1 year.
ANALYZED BY COMPUTER AIDED DETECTION. , THIS PROCEDURE WAS A DIGITAL MAMMOGRAM.

## 2009-04-27 ENCOUNTER — Encounter: Admission: RE | Admit: 2009-04-27 | Discharge: 2009-04-27 | Payer: Self-pay | Admitting: Internal Medicine

## 2009-04-27 IMAGING — MG MM SCREEN MAMMOGRAM BILATERAL
4 series · 4 of 4 positions shown · non-contrast
Comparison: none

DG SCREEN MAMMOGRAM BILATERAL
Bilateral CC and MLO view(s) were taken.

DIGITAL SCREENING MAMMOGRAM WITH CAD:
The breast tissue is heterogeneously dense.  No masses or malignant type calcifications are 
identified.  Compared with prior studies.
Images were processed with CAD.

[R CC]
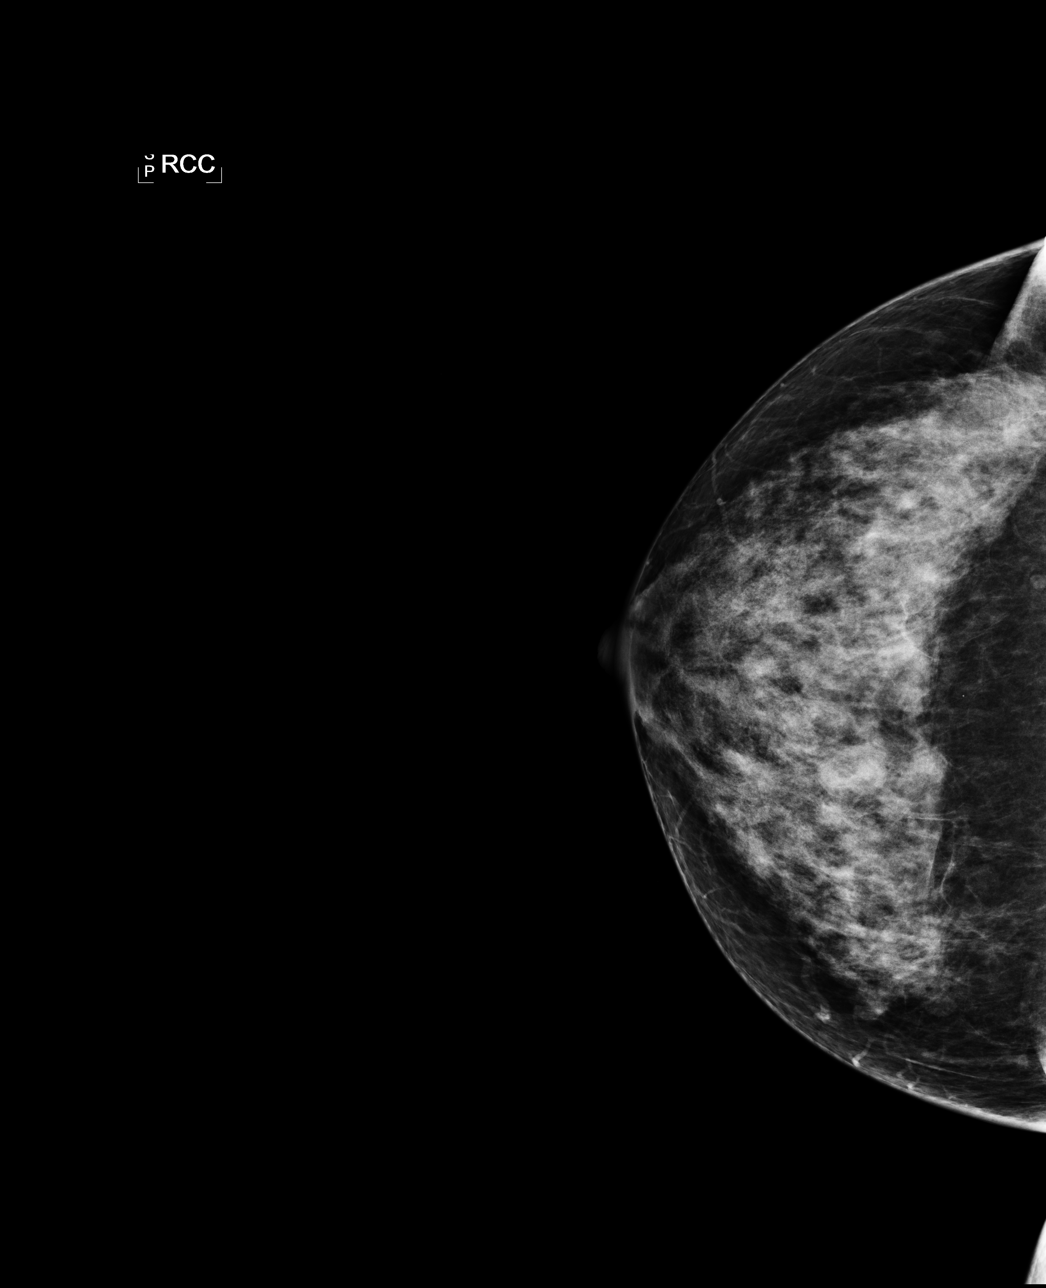

[L CC]
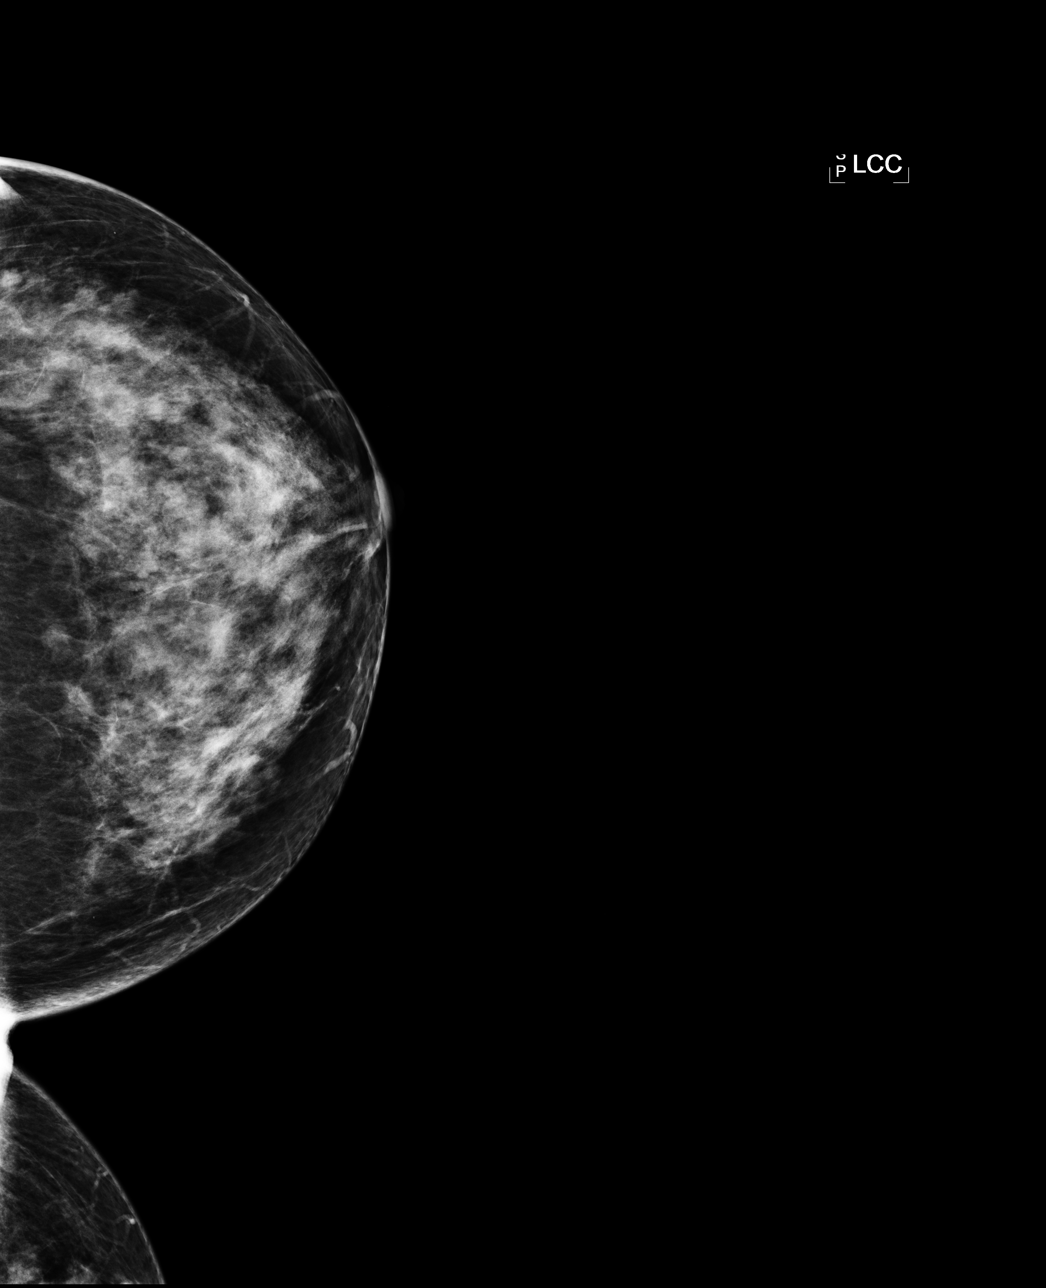

[L MLO]
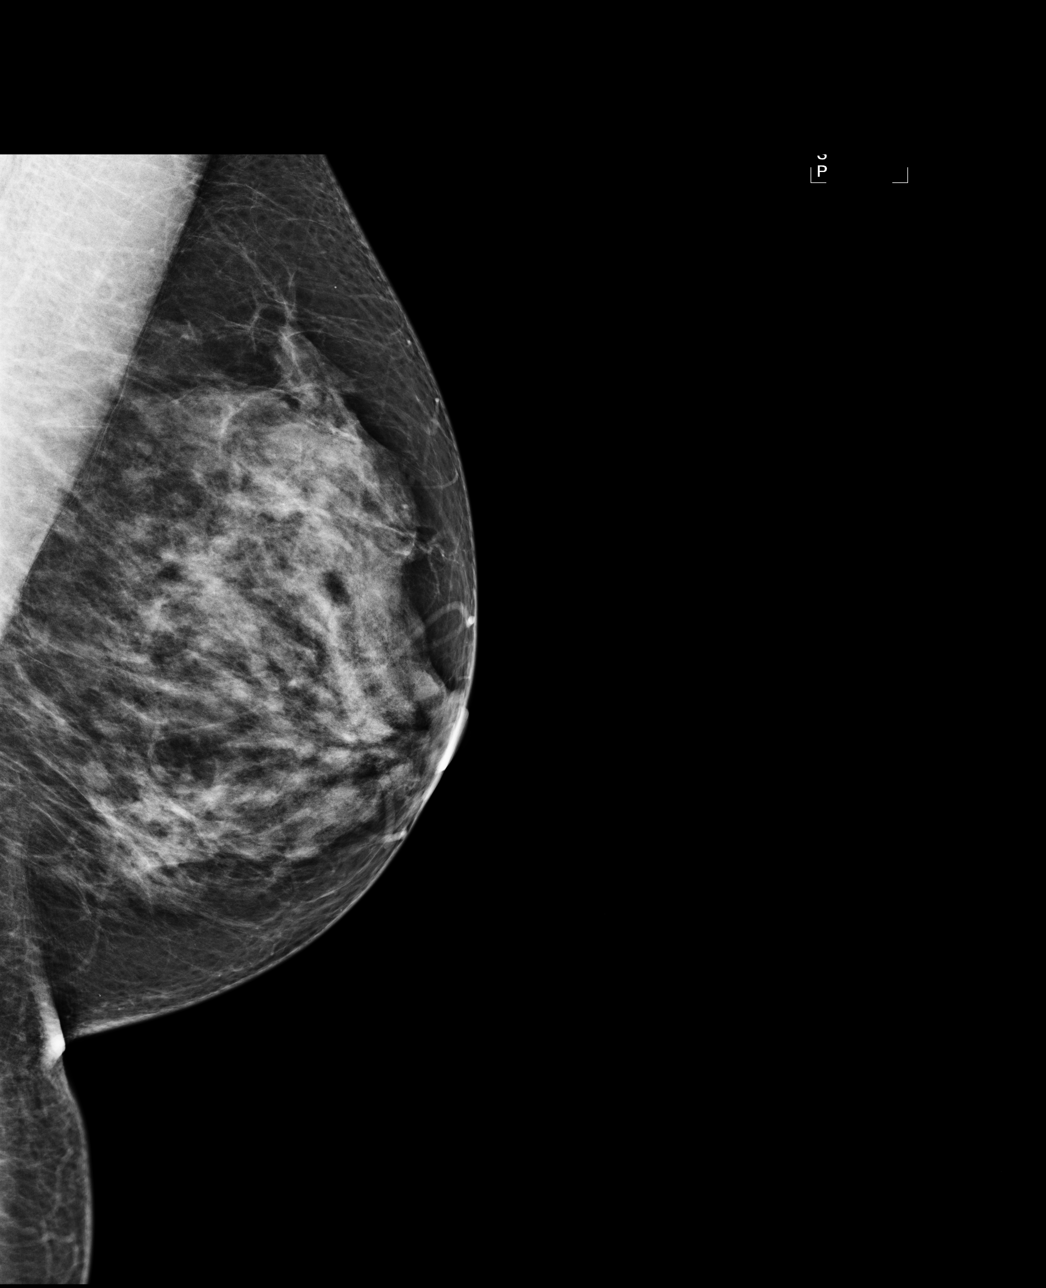

[R MLO]
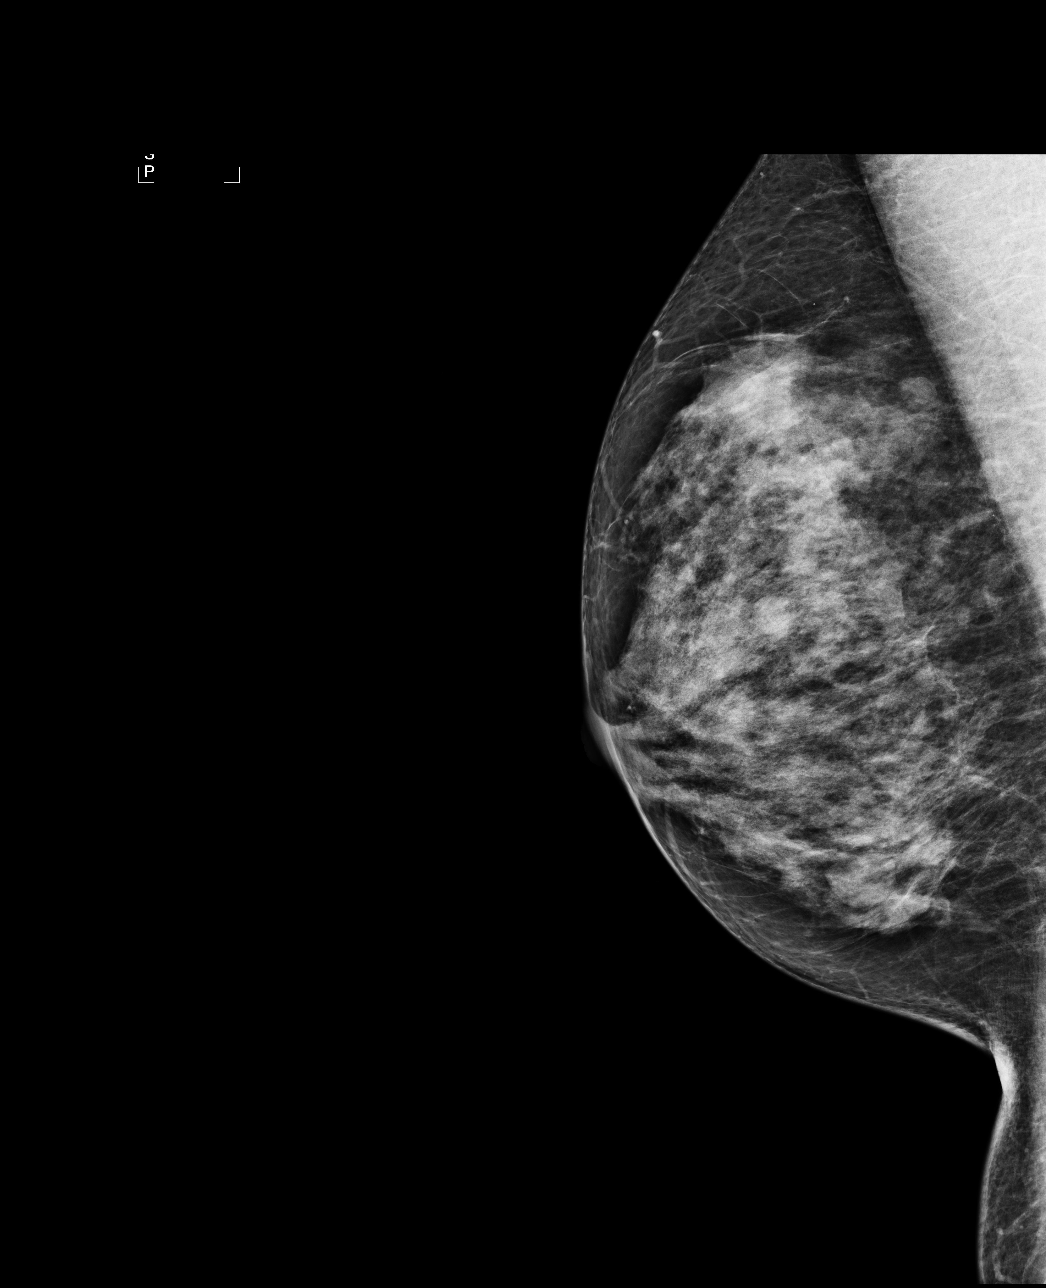

[4 of 4 positions shown; findings below may reference images not displayed]

IMPRESSION: No specific mammographic evidence of malignancy.  Next screening mammogram is recommended in one 
year.

A result letter of this screening mammogram will be mailed directly to the patient.

ASSESSMENT: Negative - BI-RADS 1

Screening mammogram in 1 year.
,

## 2010-01-28 ENCOUNTER — Encounter: Admission: RE | Admit: 2010-01-28 | Discharge: 2010-01-28 | Payer: Self-pay | Admitting: Internal Medicine

## 2010-01-28 IMAGING — MG MM DIGITAL SCREENING
4 series · 4 of 4 positions shown · non-contrast
Comparison: Prior studies.

DG SCREEN MAMMOGRAM BILATERAL
Bilateral CC and MLO view(s) were taken.

DIGITAL SCREENING MAMMOGRAM WITH CAD:

[R CC]
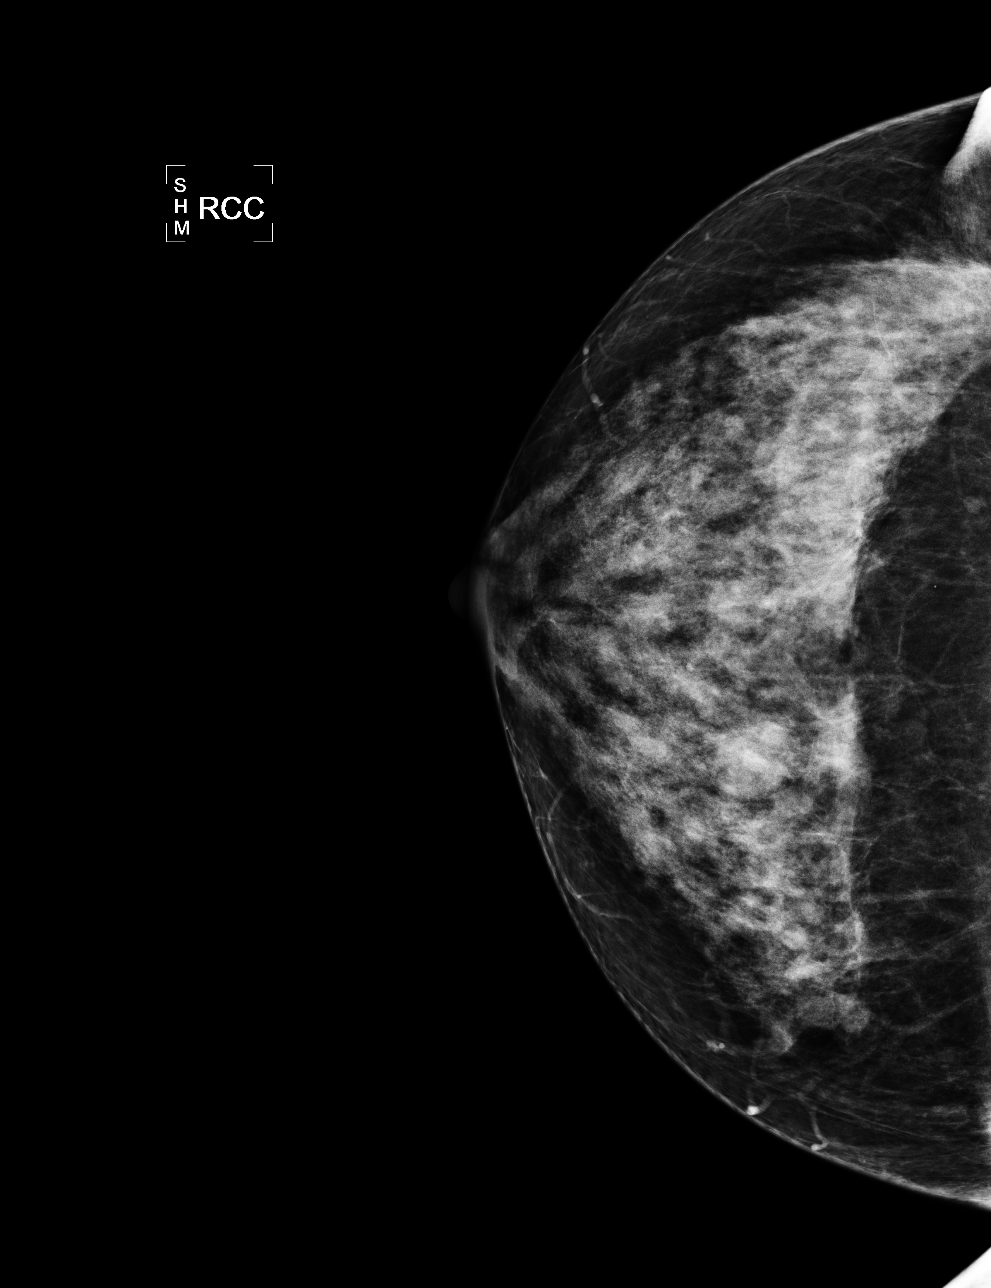

[L CC]
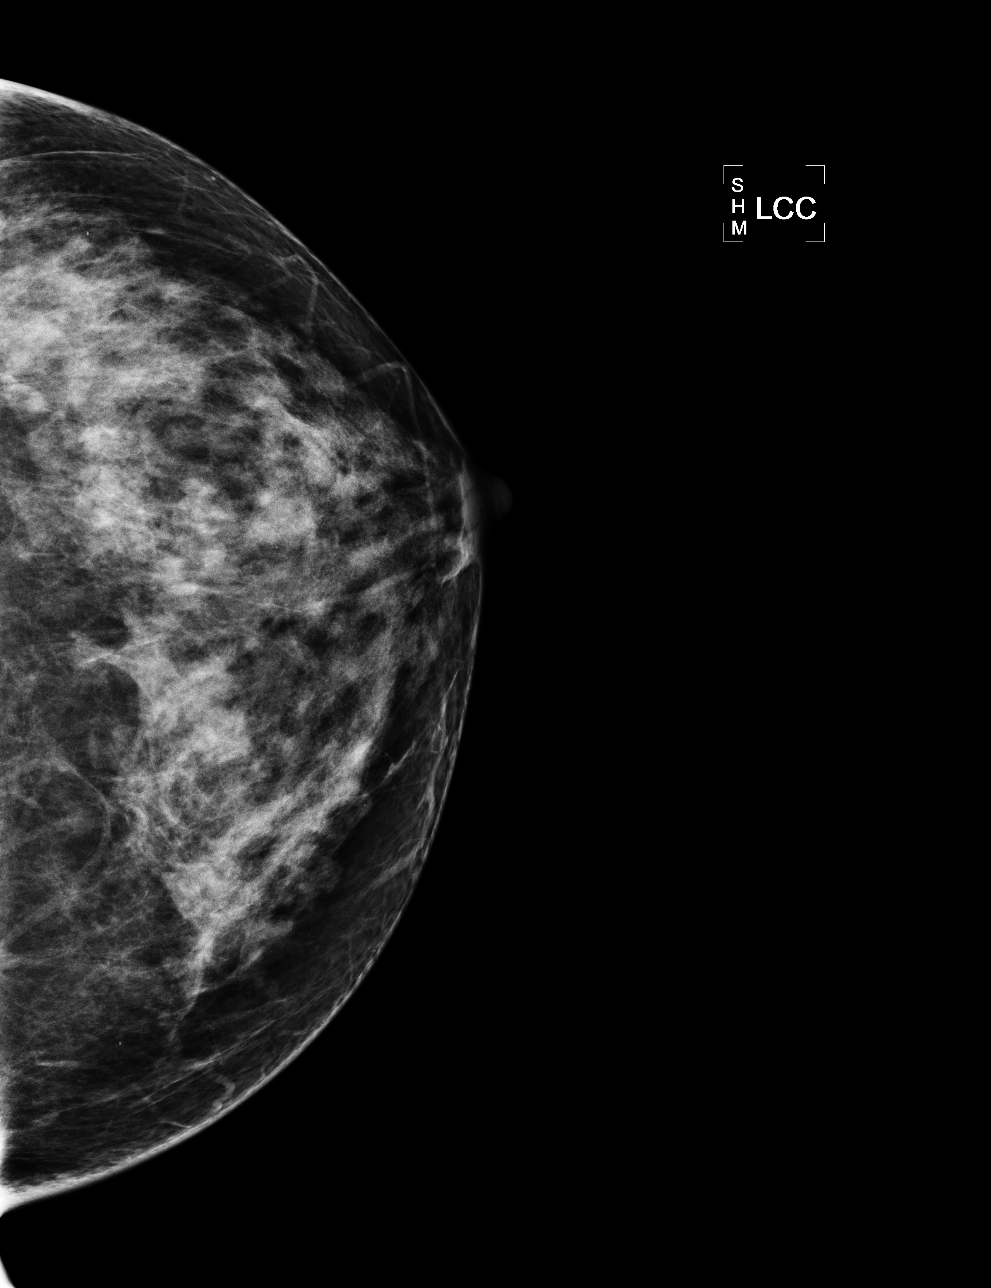

[L MLO]
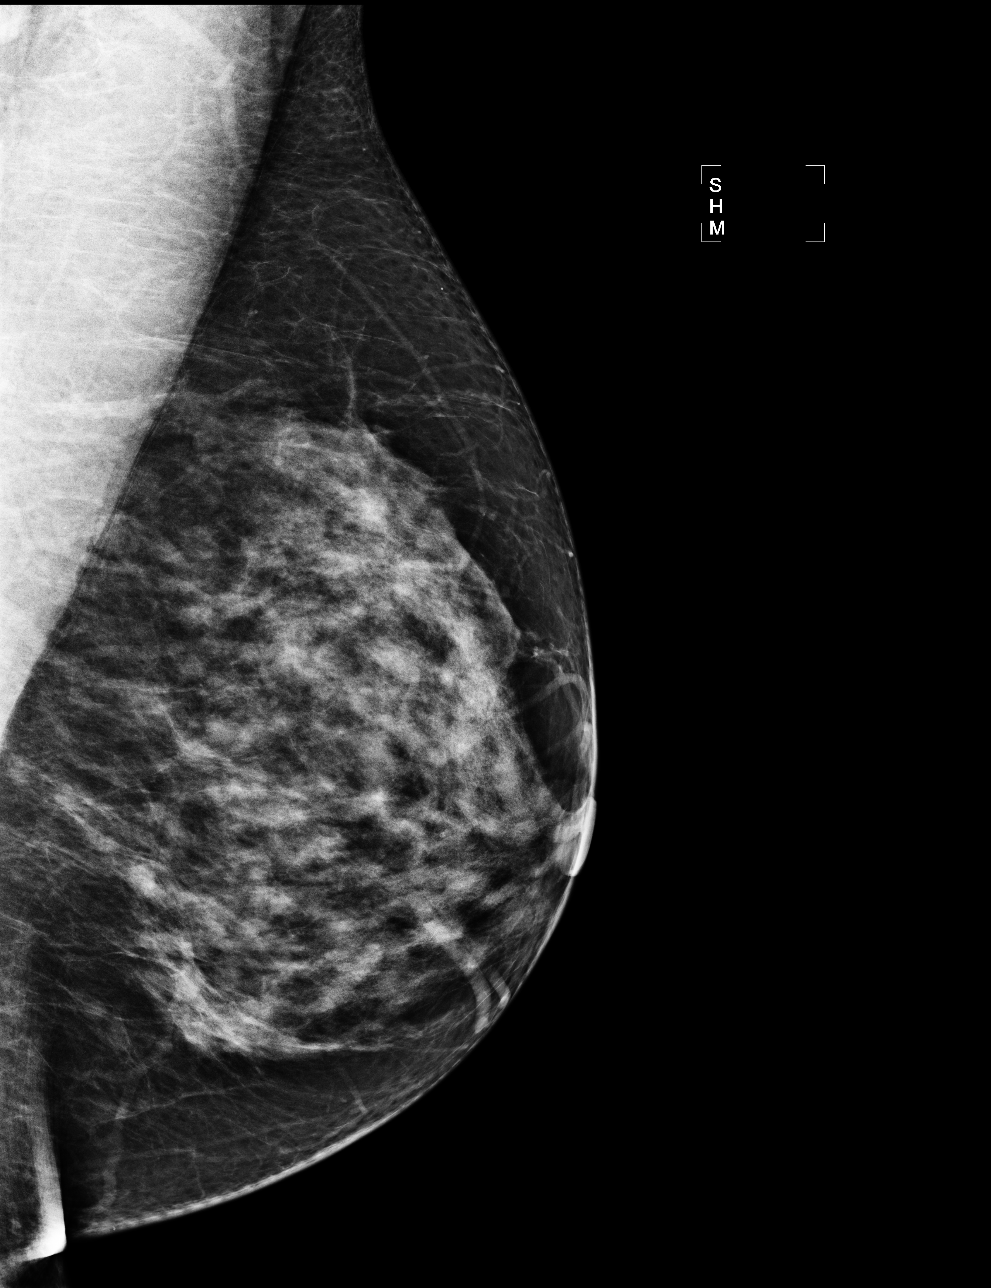

[R MLO]
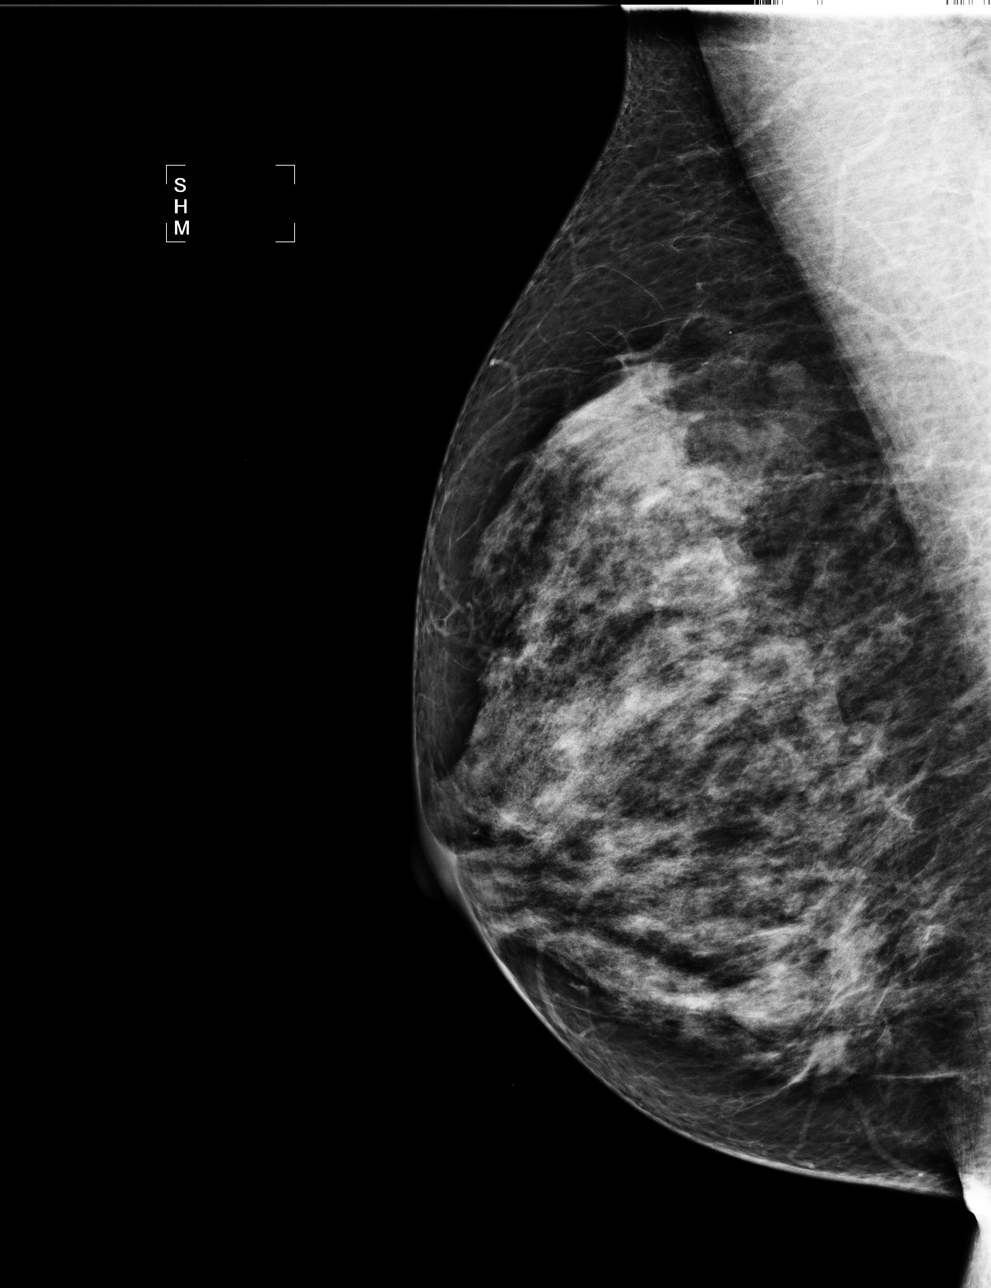

[4 of 4 positions shown; findings below may reference images not displayed]

The breast tissue is heterogeneously dense.  There is no dominant mass, architectural distortion or
calcification to suggest malignancy.

Images were processed with CAD.
IMPRESSION: No mammographic evidence of malignancy.  Suggest yearly screening mammography.

A result letter of this screening mammogram will be mailed directly to the patient.

ASSESSMENT: Negative - BI-RADS 1

Screening mammogram in 1 year.
,

## 2011-02-06 ENCOUNTER — Other Ambulatory Visit: Payer: Self-pay | Admitting: Internal Medicine

## 2011-02-06 DIAGNOSIS — Z1231 Encounter for screening mammogram for malignant neoplasm of breast: Secondary | ICD-10-CM

## 2011-02-10 ENCOUNTER — Ambulatory Visit
Admission: RE | Admit: 2011-02-10 | Discharge: 2011-02-10 | Disposition: A | Discharge status: Home / Self Care | Payer: BC Managed Care – PPO | Source: Ambulatory Visit | Attending: Internal Medicine | Admitting: Internal Medicine

## 2011-02-10 DIAGNOSIS — Z1231 Encounter for screening mammogram for malignant neoplasm of breast: Secondary | ICD-10-CM

## 2012-01-31 ENCOUNTER — Other Ambulatory Visit: Payer: Self-pay | Admitting: Family Medicine

## 2012-02-29 ENCOUNTER — Other Ambulatory Visit: Payer: Self-pay | Admitting: Internal Medicine

## 2012-02-29 DIAGNOSIS — Z1231 Encounter for screening mammogram for malignant neoplasm of breast: Secondary | ICD-10-CM

## 2012-03-25 ENCOUNTER — Ambulatory Visit: Payer: BC Managed Care – PPO

## 2012-03-26 ENCOUNTER — Ambulatory Visit
Admission: RE | Admit: 2012-03-26 | Discharge: 2012-03-26 | Disposition: A | Discharge status: Home / Self Care | Payer: BC Managed Care – PPO | Source: Ambulatory Visit | Attending: Internal Medicine | Admitting: Internal Medicine

## 2012-03-26 ENCOUNTER — Ambulatory Visit (INDEPENDENT_AMBULATORY_CARE_PROVIDER_SITE_OTHER): Payer: BC Managed Care – PPO | Admitting: Family Medicine

## 2012-03-26 ENCOUNTER — Encounter: Payer: Self-pay | Admitting: Family Medicine

## 2012-03-26 VITALS — BP 119/74 | HR 70 | Temp 97.6°F | Resp 16 | Ht 67.5 in | Wt 203.6 lb

## 2012-03-26 DIAGNOSIS — E119 Type 2 diabetes mellitus without complications: Secondary | ICD-10-CM

## 2012-03-26 DIAGNOSIS — Z1231 Encounter for screening mammogram for malignant neoplasm of breast: Secondary | ICD-10-CM

## 2012-03-26 DIAGNOSIS — E559 Vitamin D deficiency, unspecified: Secondary | ICD-10-CM

## 2012-03-26 DIAGNOSIS — M1990 Unspecified osteoarthritis, unspecified site: Secondary | ICD-10-CM

## 2012-03-26 DIAGNOSIS — E669 Obesity, unspecified: Secondary | ICD-10-CM

## 2012-03-26 DIAGNOSIS — E785 Hyperlipidemia, unspecified: Secondary | ICD-10-CM

## 2012-03-26 DIAGNOSIS — N951 Menopausal and female climacteric states: Secondary | ICD-10-CM

## 2012-03-26 DIAGNOSIS — Z0000 Encounter for general adult medical examination without abnormal findings: Secondary | ICD-10-CM

## 2012-03-26 LAB — POCT URINALYSIS DIPSTICK
Bilirubin, UA: NEGATIVE
Ketones, UA: NEGATIVE
Protein, UA: NEGATIVE
Urobilinogen, UA: 0.2

## 2012-03-26 LAB — COMPREHENSIVE METABOLIC PANEL
ALT: 15 U/L (ref 0–35)
AST: 16 U/L (ref 0–37)
BUN: 14 mg/dL (ref 6–23)
Calcium: 10.1 mg/dL (ref 8.4–10.5)
Chloride: 103 mEq/L (ref 96–112)
Creat: 1.02 mg/dL (ref 0.50–1.10)
Glucose, Bld: 88 mg/dL (ref 70–99)
Potassium: 4 mEq/L (ref 3.5–5.3)
Sodium: 142 mEq/L (ref 135–145)
Total Bilirubin: 1 mg/dL (ref 0.3–1.2)

## 2012-03-26 LAB — POCT GLYCOSYLATED HEMOGLOBIN (HGB A1C): Hemoglobin A1C: 6.1

## 2012-03-26 MED ORDER — HYDROCHLOROTHIAZIDE 12.5 MG PO CAPS: 12.5000 mg | ORAL_CAPSULE | ORAL | Status: DC

## 2012-03-26 MED ORDER — PRAVASTATIN SODIUM 20 MG PO TABS: 20.0000 mg | ORAL_TABLET | Freq: Every day | ORAL | Status: DC

## 2012-03-26 NOTE — Progress Notes (Signed)
Subjective:    Patient ID: Virginia Larson, female    DOB: 1951-02-20, 61 y.o.   MRN: [id number]  HPI  This 61 y.o. AA female is here for CPE; she resides part-time in [place] and part-time in [place]  since retirement from [employer] for 34 years). She has HTN, DM and Lipid disorder,  on medications without side effects. She is a nonsmoker and does not consume alcohol and exercises  daily for 1 hour. She is scheduled for MMG today.   Has never had a colonoscopy but would like to have that done here in [place] later this year.    She was encouraged to establish with a PCP in [place] at her last visit here in October but she prefers  to receive primary care here.    Review of Systems  Constitutional: Positive for diaphoresis.  HENT: Positive for congestion, mouth sores, neck stiffness and sinus pressure.   Eyes: Negative.   Respiratory: Negative.   Cardiovascular: Positive for leg swelling.  Gastrointestinal: Negative.   Genitourinary: Negative.   Musculoskeletal: Positive for myalgias, back pain, joint swelling, arthralgias and gait problem.  Skin: Negative.   Neurological: Negative.   Hematological: Negative.   Psychiatric/Behavioral: Positive for disturbed wake/sleep cycle.       Objective:   Physical Exam  Nursing note and vitals reviewed. Constitutional: She is oriented to person, place, and time. She appears well-developed and well-nourished. No distress.  HENT:  Head: Normocephalic and atraumatic.  Right Ear: External ear normal.  Left Ear: External ear normal.  Nose: Nose normal.  Mouth/Throat: Oropharynx is clear and moist.  Eyes: Conjunctivae and EOM are normal. Pupils are equal, round, and reactive to light. No scleral icterus.  Neck: Normal range of motion. Neck supple. No thyromegaly present.  Cardiovascular: Normal rate, regular rhythm and normal heart sounds.  Exam reveals no gallop and no friction rub.   No murmur  heard. Pulmonary/Chest: Effort normal and breath sounds normal. No respiratory distress. She has no wheezes. Right breast exhibits no inverted nipple, no mass, no nipple discharge, no skin change and no tenderness. Left breast exhibits no inverted nipple, no mass, no nipple discharge, no skin change and no tenderness.  Abdominal: Soft. Bowel sounds are normal. She exhibits no mass. There is no tenderness. There is no guarding.       No organomegaly  Musculoskeletal: She exhibits edema and tenderness.       Lower extremities-  Tender with palpation of shins (trace edema); decreased ROM in knees and ankles with mild effusion of left knee. No joint laxity. +crepitus  Lymphadenopathy:    She has no cervical adenopathy.  Neurological: She is alert and oriented to person, place, and time. She has normal reflexes. No cranial nerve deficit. She exhibits normal muscle tone. Coordination normal.  Skin: Skin is warm. No rash noted. No erythema.  Psychiatric: She has a normal mood and affect. Her behavior is normal. Judgment and thought content normal.    A1C= 6.1%     (07/21/11- A1C= 6.2%)  LABS: (07/21/11)- Cr= 1.04   TChol= 173  TGs= 49   HDL= 53  LDL= 110      Assessment & Plan:   1. Routine general medical examination at a health care facility    2. Vitamin d deficiency  Vitamin D, 25-hydroxy  3. Symptoms, such as flushing, sleeplessness, headache, lack of concentration, associated with the menopause  Discussed strategies to improve sleep hygiene and reduce menopausal symptoms  4.  Hyperlipidemia LDL goal < 100  Comprehensive metabolic panel, Lipid panel, TSH  5. Type II or unspecified type diabetes mellitus without mention of complication, not stated as uncontrolled  Comprehensive metabolic panel Continue current medications and meal plan with daily exercise Encouraged to monitor FSBS  6. Obesity (BMI 30.0-34.9)  Advised weight reduction would help reduce joint discomfort

## 2012-03-26 NOTE — Patient Instructions (Addendum)
Obesity Obesity is defined as having a body mass index (BMI) of 30 or more. To calculate your BMI divide your weight in pounds by your height in inches squared and multiply that product by 703. Major illnesses resulting from long-term obesity include:  Stroke.   Heart disease.   Diabetes.   Many cancers.   Arthritis.  Obesity also complicates recovery from many other medical problems.  CAUSES   A history of obesity in your parents.   Thyroid hormone imbalance.   Environmental factors such as excess calorie intake and physical inactivity.  TREATMENT  A healthy weight loss program includes:  A calorie restricted diet based on individual calorie needs.   Increased physical activity (exercise).  An exercise program is just as important as the right low-calorie diet.  Weight-loss medicines should be used only under the supervision of your physician. These medicines help, but only if they are used with diet and exercise programs. Medicines can have side effects including nervousness, nausea, abdominal pain, diarrhea, headache, drowsiness, and depression.  An unhealthy weight loss program includes:  Fasting.   Fad diets.   Supplements and drugs.  These choices do not succeed in long-term weight control.  HOME CARE INSTRUCTIONS  To help you make the needed dietary changes:   Exercise and perform physical activity as directed by your caregiver.   Keep a daily record of everything you eat. There are many free websites to help you with this. It may be helpful to measure your foods so you can determine if you are eating the correct portion sizes.   Use low-calorie cookbooks or take special cooking classes.   Avoid alcohol. Drink more water and drinks with no calories.   Take vitamins and supplements only as recommended by your caregiver.   Weight loss support groups, Registered Dieticians, counselors, and stress reduction education can also be very helpful.  Document Released:  10/19/2004 Document Revised: 08/31/2011 Document Reviewed: 08/18/2007 [facility] Patient Information 2012 [place], [place]     .1200 Calorie Diabetic Diet The 1200 calorie diabetic diet limits calories to 1200 each day. Following this diet and making healthy meal choices can help improve overall health. It controls blood glucose (sugar) levels and can also help lower blood pressure and cholesterol.  SERVING SIZES Measuring foods and serving sizes helps to make sure you are getting the right amount of food. The list below tells how big or small some common serving sizes are.   1 oz.........4 stacked dice.   3 oz........Marland KitchenDeck of cards.   1 tsp.......Marland KitchenTip of little finger.   1 tbs......Marland KitchenMarland KitchenThumb.   2 tbs.......Marland KitchenGolf ball.    cup......Marland KitchenHalf of a fist.   1 cup.......Marland KitchenA fist.  GUIDELINES FOR CHOOSING FOODS The goal of this diet is to eat a variety of foods and limit calories to 1200 each day. This can be done by choosing foods that are low in calories and fat. The diet also suggests eating small amounts of food frequently. Doing this helps control your blood glucose levels, so they do not get too high or too low. Each meal or snack may include a protein food source to help you feel more satisfied. Try to eat about the same amount of food around the same time each day. This includes weekend days, travel days, and days off work. Space your meals about 4 to 5 hours apart, and add a snack between them, if you wish.  For example, a daily food plan could include breakfast, a morning snack, lunch, dinner, and  an evening snack. Healthy meals and snacks have different types of foods, including whole grains, vegetables, fruits, lean meats, poultry, fish, and dairy products. As you plan your meals, select a variety of foods. Choose from the bread and starch, vegetable, fruit, dairy, and meat/protein groups. Examples of foods from each group are listed below, with their suggested serving sizes. Use measuring  cups and spoons to become familiar with what a healthy portion looks like. Bread and Starch Each serving equals 15 grams of carbohydrate.  1 slice bread.    bagel.    cup cold cereal (unsweetened).    cup hot cereal or mashed potatoes.   1 small potato (size of a computer mouse).   ? cup cooked pasta or rice.    English muffin.   1 cup broth-based soup.   3 cups of popcorn.   4 to 6 whole-wheat crackers.    cup cooked beans, peas, or corn.  Vegetables Each serving equals 5 grams of carbohydrate.   cup cooked vegetables.   1 cup raw vegetables.    cup tomato or vegetable juice.  Fruit Each serving equals 15 grams of carbohydrate.  1 small apple or orange.   1  cup watermelon or strawberries.    cup applesauce (no sugar added).   2 tbs raisins.    banana.    cup canned fruit, packed in water or in its own juice.    cup unsweetened fruit juice.  Dairy Each serving equals 12 to 15 grams of carbohydrate.  1 cup fat-free milk.   6 oz artificially sweetened yogurt or plain yogurt.   1 cup low-fat buttermilk.   1 cup soy milk.   1 cup almond milk.  Meat/Protein  1 large egg.   2 to 3 oz meat, poultry, or fish.    cup low-fat cottage cheese.   1 tbs peanut butter.   1 oz low-fat cheese.    cup tuna, packed in water.    cup tofu.  Fat  1 tsp oil.   1 tsp trans-fat-free margarine.   1 tsp butter.   1 tsp mayonnaise.   2 tbs avocado.   1 tbs salad dressing.   1 tbs cream cheese.   2 tbs sour cream.  SAMPLE 1200 CALORIE DIET PLAN Breakfast  1 cup fat-free milk (1 carb serving).   1 small orange (1 carb serving).   1 scrambled egg.   1 slice whole-wheat toast (1 carb serving).  Lunch  Sandwich   2 slices whole-wheat bread (2 carb servings).   2 oz lean meat.   2 tsp reduced fat mayonnaise.   1 lettuce leaf.   2 slices tomato.   1 cup carrot sticks.  Afternoon Snack  1 small apple (1 carb serving).    1 string cheese.  Dinner  2 oz meat.   1 small baked potato (1 carb serving).   1 tsp trans-fat-free margarine.   1 cup steamed broccoli.   1 cup fat-free milk (1 carb serving).  Evening Snack   small banana (1 carb serving).   6 vanilla wafers (1 carb serving).  MEAL PLAN You can use this worksheet to help you make a daily meal plan based on the 1200 calorie diabetic diet suggestions. If you are using this plan to help you control your blood glucose, you may interchange carbohydrate containing foods (dairy, starches, and fruits). Select a variety of fresh foods of varying colors and flavors. The total amount of carbohydrate in  your meals or snacks is more important than making sure you include all of the food groups every time you eat. You can choose from approximately this many of the following foods to build your day's meals:  6 Starches.   3 Vegetables.   2 Fruits.   2 Dairy.   4 to 6 oz Meat/Protein.   Up to 3 Fats.  Your dietician can use this worksheet to help you decide how many servings and which types of foods are right for you. BREAKFAST Food Group and Servings / Food Choice Starch _________________________________________________________ Dairy __________________________________________________________ Fruit ___________________________________________________________ Meat/Protein____________________________________________________ Fat ____________________________________________________________ LUNCH Food Group and Servings / Food Choice  Starch _________________________________________________________ Meat/Protein ___________________________________________________ Vegetables _____________________________________________________ Fruit __________________________________________________________ Dairy __________________________________________________________ Fat ____________________________________________________________ Aura Fey Food Group and  Servings / Food Choice Dairy __________________________________________________________ Fruit ___________________________________________________________ Starch __________________________________________________________ Meat/Protein____________________________________________________ Laural Golden Food Group and Servings / Food Choice Starch _________________________________________________________ Meat/Protein ___________________________________________________ Dairy __________________________________________________________ Vegetables _____________________________________________________ Fruit __________________________________________________________ Fat ____________________________________________________________ Lollie Sails Food Group and Servings / Food Choice Fruit ___________________________________________________________ Meat/Protein ____________________________________________________ Dairy __________________________________________________________ Starch __________________________________________________________ DAILY TOTALS Starches _________________________ Vegetables _______________________ Fruits ____________________________ Dairy ____________________________ Meat/Protein______________________ Fats _____________________________ Document Released: 04/03/2005 Document Revised: 08/31/2011 Document Reviewed: 07/29/2009 [employer] Patient Information 2012 [place], [place].    Keeping You Healthy  Get These Tests  Blood Pressure- Have your blood pressure checked by your healthcare provider at least once a year.  Normal blood pressure is 120/80.  Weight- Have your body mass index (BMI) calculated to screen for obesity.  BMI is a measure of body fat based on height and weight.  You can calculate your own BMI at https://www.west-esparza.com/  Cholesterol- Have your cholesterol checked every year.  Diabetes- Have your blood sugar checked every year if you have high blood pressure,  high cholesterol, a family history of diabetes or if you are overweight.  Pap Smear- Have a pap smear every 1 to 3 years if you have been sexually active.  If you are older than 65 and recent pap smears have been normal you may not need additional pap smears.  In addition, if you have had a hysterectomy  For benign disease additional pap smears are not necessary.  Mammogram-Yearly mammograms are essential for early detection of breast cancer  Screening for Colon Cancer- Colonoscopy starting at age 65. Screening may begin sooner depending on your family history and other health conditions.  Follow up colonoscopy as directed by your Gastroenterologist.  Screening for Osteoporosis- Screening begins at age 62 with bone density scanning, sooner if you are at higher risk for developing Osteoporosis.  Get these medicines  Calcium with Vitamin D- Your body requires 1200-1500 mg of Calcium a day and [id number] IU of Vitamin D a day.  You can only absorb 500 mg of Calcium at a time therefore Calcium must be taken in 2 or 3 separate doses throughout the day.  Hormones- Hormone therapy has been associated with increased risk for certain cancers and heart disease.  Talk to your healthcare provider about if you need relief from menopausal symptoms.  Aspirin- Ask your healthcare provider about taking Aspirin to prevent Heart Disease and Stroke.  Get these Immuniztions  Flu shot- Every fall  Pneumonia shot- Once after the age of 17; if you are younger ask your healthcare provider if you need a pneumonia shot.  Tetanus- Every ten years.  Zostavax- Once after the age of 82 to prevent shingles.  Take these steps  Don't smoke- Your healthcare provider can help you quit. For tips on how to  quit, ask your healthcare provider or go to www.smokefree.gov or call 1-800 QUIT-NOW.  Be physically active- Exercise 5 days a week for a minimum of 30 minutes.  If you are not already physically active, start slow and  gradually work up to 30 minutes of moderate physical activity.  Try walking, dancing, bike riding, swimming, etc.  Eat a healthy diet- Eat a variety of healthy foods such as fruits, vegetables, whole grains, low fat milk, low fat cheeses, yogurt, lean meats, chicken, fish, eggs, dried beans, tofu, etc.  For more information go to www.thenutritionsource.org  Dental visit- Brush and floss teeth twice daily; visit your dentist twice a year.  Eye exam- Visit your Optometrist or Ophthalmologist yearly.  Drink alcohol in moderation- Limit alcohol intake to one drink or less a day.  Never drink and drive.  Depression- Your emotional health is as important as your physical health.  If you're feeling down or losing interest in things you normally enjoy, please talk to your healthcare provider.  Seat Belts- can save your life; always wear one  Smoke/Carbon Monoxide detectors- These detectors need to be installed on the appropriate level of your home.  Replace batteries at least once a year.  Violence- If anyone is threatening or hurting you, please tell your healthcare provider. Living Will/ Health care power of attorney- Discuss with your healthcare provider and family.

## 2012-03-27 LAB — VITAMIN D 25 HYDROXY (VIT D DEFICIENCY, FRACTURES): Vit D, 25-Hydroxy: 31 ng/mL (ref 30–89)

## 2012-03-27 NOTE — Progress Notes (Signed)
Quick Note:  Please call pt and advise that the following labs are abnormal... Vitamin D is in the low normal range; need to get OTC Vitamin D supplement 2000 IU and take 1 capsule every day. Try to eat salmon, tuna or sardines, mushrooms, eggs and dairy products some days of the week.  While you are here and when you return to [place], you have to get some sun exposure (10- 15 minutes) most days of the week to help keep your Vitamin D in a healthy range.  All other labs are normal. Lipids look great! Stay on Pravastatin and other medications.  Copy to pt. ______

## 2012-04-02 ENCOUNTER — Other Ambulatory Visit: Payer: Self-pay | Admitting: Family Medicine

## 2012-04-02 DIAGNOSIS — Z1231 Encounter for screening mammogram for malignant neoplasm of breast: Secondary | ICD-10-CM

## 2012-04-03 DIAGNOSIS — E785 Hyperlipidemia, unspecified: Secondary | ICD-10-CM | POA: Insufficient documentation

## 2012-04-03 DIAGNOSIS — E1165 Type 2 diabetes mellitus with hyperglycemia: Secondary | ICD-10-CM | POA: Insufficient documentation

## 2012-04-03 DIAGNOSIS — E669 Obesity, unspecified: Secondary | ICD-10-CM | POA: Insufficient documentation

## 2012-04-03 DIAGNOSIS — M1990 Unspecified osteoarthritis, unspecified site: Secondary | ICD-10-CM | POA: Insufficient documentation

## 2012-04-03 DIAGNOSIS — E559 Vitamin D deficiency, unspecified: Secondary | ICD-10-CM | POA: Insufficient documentation

## 2012-04-09 ENCOUNTER — Encounter: Payer: Self-pay | Admitting: Family Medicine

## 2012-05-09 ENCOUNTER — Other Ambulatory Visit: Payer: Self-pay | Admitting: Physician Assistant

## 2012-08-20 ENCOUNTER — Other Ambulatory Visit: Payer: Self-pay | Admitting: Family Medicine

## 2012-08-20 ENCOUNTER — Telehealth: Payer: Self-pay | Admitting: *Deleted

## 2012-08-20 MED ORDER — ESTRADIOL 0.025 MG/24HR TD PTWK: 1.0000 | MEDICATED_PATCH | TRANSDERMAL | Status: DC

## 2012-08-20 NOTE — Telephone Encounter (Signed)
Pharmacy requesting refill on estradiol 0.025mg /day patches  Apply 1 patch as directed and change once a week for 12 months.  Last refill 07/08/12

## 2012-09-04 ENCOUNTER — Other Ambulatory Visit: Payer: Self-pay | Admitting: Physician Assistant

## 2013-02-06 ENCOUNTER — Other Ambulatory Visit: Payer: Self-pay

## 2013-02-06 DIAGNOSIS — Z1231 Encounter for screening mammogram for malignant neoplasm of breast: Secondary | ICD-10-CM

## 2013-02-07 ENCOUNTER — Ambulatory Visit: Payer: BC Managed Care – PPO

## 2013-02-07 ENCOUNTER — Ambulatory Visit (INDEPENDENT_AMBULATORY_CARE_PROVIDER_SITE_OTHER): Payer: BC Managed Care – PPO | Admitting: Family Medicine

## 2013-02-07 VITALS — BP 128/86 | HR 72 | Temp 97.6°F | Resp 16 | Ht 67.5 in | Wt 190.0 lb

## 2013-02-07 DIAGNOSIS — M545 Low back pain: Secondary | ICD-10-CM

## 2013-02-07 DIAGNOSIS — I10 Essential (primary) hypertension: Secondary | ICD-10-CM | POA: Insufficient documentation

## 2013-02-07 DIAGNOSIS — R7302 Impaired glucose tolerance (oral): Secondary | ICD-10-CM

## 2013-02-07 DIAGNOSIS — M5432 Sciatica, left side: Secondary | ICD-10-CM

## 2013-02-07 DIAGNOSIS — R55 Syncope and collapse: Secondary | ICD-10-CM | POA: Insufficient documentation

## 2013-02-07 DIAGNOSIS — R7309 Other abnormal glucose: Secondary | ICD-10-CM

## 2013-02-07 LAB — POCT CBC
HCT, POC: 40.6 % (ref 37.7–47.9)
Hemoglobin: 12.9 g/dL (ref 12.2–16.2)
Lymph, poc: 2.2 (ref 0.6–3.4)
MCHC: 31.8 g/dL (ref 31.8–35.4)
MCV: 96.6 fL (ref 80–97)
Platelet Count, POC: 212 10*3/uL (ref 142–424)
RBC: 4.2 M/uL (ref 4.04–5.48)
RDW, POC: 14.4 %

## 2013-02-07 LAB — COMPREHENSIVE METABOLIC PANEL
ALT: 10 U/L (ref 0–35)
AST: 13 U/L (ref 0–37)
Albumin: 4.3 g/dL (ref 3.5–5.2)
Alkaline Phosphatase: 89 U/L (ref 39–117)
BUN: 16 mg/dL (ref 6–23)
CO2: 29 mEq/L (ref 19–32)
Chloride: 104 mEq/L (ref 96–112)
Glucose, Bld: 134 mg/dL — ABNORMAL HIGH (ref 70–99)

## 2013-02-07 LAB — POCT GLYCOSYLATED HEMOGLOBIN (HGB A1C): Hemoglobin A1C: 6.1

## 2013-02-07 IMAGING — CR DG LUMBAR SPINE 2-3V
2 series · 2 of 2 positions shown · non-contrast
Comparison: None.

CLINICAL DATA: Low back pain

LUMBAR SPINE - 2-3 VIEW

[AP]
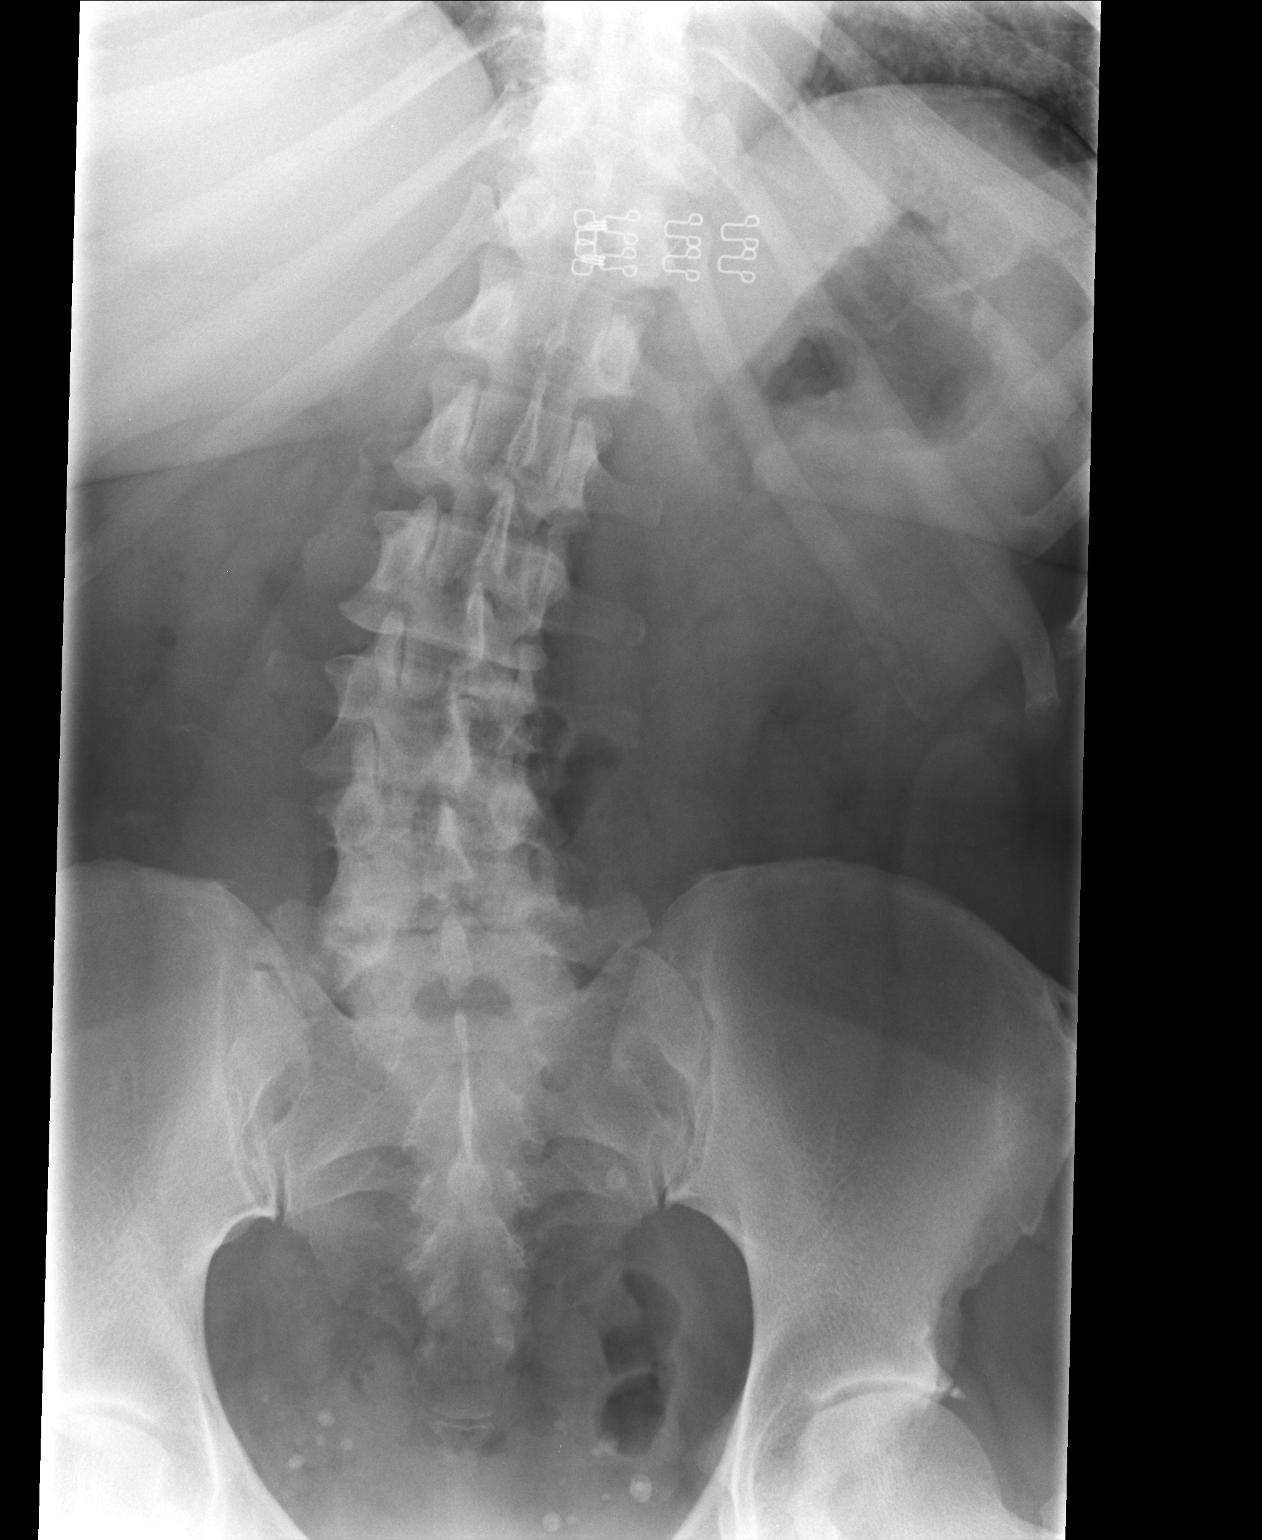

[lateral]
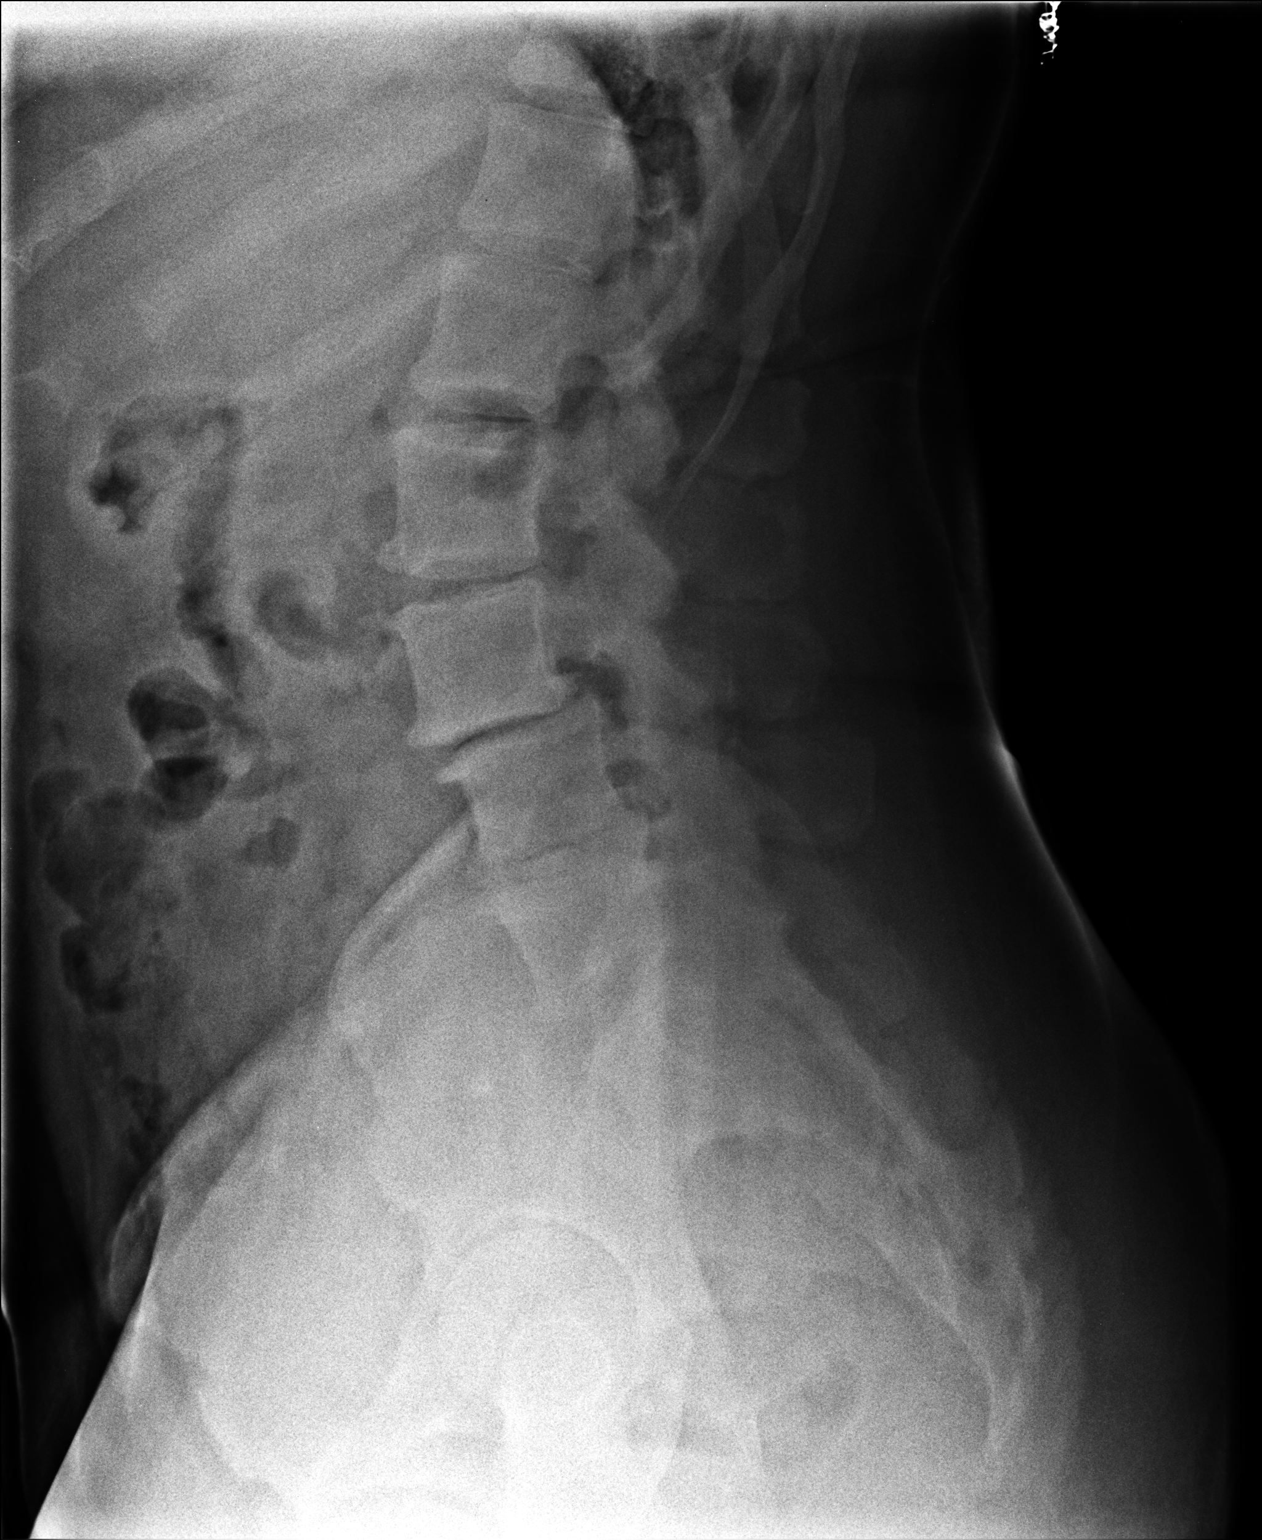

[2 of 2 positions shown; findings below may reference images not displayed]

FINDINGS: There are apparently six non-rib bearing lumbar type for table
bodies.  For the purposes of this dictation, lumbar vertebral
levels will be labeled L1-L6.

Moderate scoliotic curvature of the thoracolumbar spine with
inferior curvature convex to the right.  There is minimal
(approximately 8 mm) of anterolisthesis of L4 upon L5.

Lumbar vertebral body heights are preserved.

There is moderate multilevel lumbar spine DDD, likely worse at L4 -
L5 and to a lesser extent, L3 - L4 and L5 - L6 with disc space
height loss, end plate irregularity and sclerosis.

Limited visualization of the bilateral SI joints is normal.

Paucity of bowel gas.  Multiple phleboliths overlie the lower
pelvis.
IMPRESSION: 1.  Moderate multilevel lumbar spine DDD, worst at L4 - L5.
2.  Moderate scoliotic curvature of the thoracolumbar spine.
3.  Grade 1 (measuring 8 mm) anterolisthesis of L4 upon L5.

## 2013-02-07 MED ORDER — HYDROCHLOROTHIAZIDE 12.5 MG PO CAPS: 12.5000 mg | ORAL_CAPSULE | ORAL | Status: DC

## 2013-02-07 MED ORDER — CYCLOBENZAPRINE HCL 5 MG PO TABS: ORAL_TABLET | ORAL | Status: DC

## 2013-02-07 MED ORDER — PREDNISONE 20 MG PO TABS: ORAL_TABLET | ORAL | Status: DC

## 2013-02-07 NOTE — Patient Instructions (Signed)
Sciatica with Rehab The sciatic nerve runs from the back down the leg and is responsible for sensation and control of the muscles in the back (posterior) side of the thigh, lower leg, and foot. Sciatica is a condition that is characterized by inflammation of this nerve.  SYMPTOMS   Signs of nerve damage, including numbness and/or weakness along the posterior side of the lower extremity.  Pain in the back of the thigh that may also travel down the leg.  Pain that worsens when sitting for long periods of time.  Occasionally, pain in the back or buttock. CAUSES  Inflammation of the sciatic nerve is the cause of sciatica. The inflammation is due to something irritating the nerve. Common sources of irritation include:  Sitting for long periods of time.  Direct trauma to the nerve.  Arthritis of the spine.  Herniated or ruptured disk.  Slipping of the vertebrae (spondylolithesis)  Pressure from soft tissues, such as muscles or ligament-like tissue (fascia). RISK INCREASES WITH:  Sports that place pressure or stress on the spine (football or weightlifting).  Poor strength and flexibility.  Failure to warm-up properly before activity.  Family history of low back pain or disk disorders.  Previous back injury or surgery.  Poor body mechanics, especially when lifting, or poor posture. PREVENTION   Warm up and stretch properly before activity.  Maintain physical fitness:  Strength, flexibility, and endurance.  Cardiovascular fitness.  Learn and use proper technique, especially with posture and lifting. When possible, have coach correct improper technique.  Avoid activities that place stress on the spine. PROGNOSIS If treated properly, then sciatica usually resolves within 6 weeks. However, occasionally surgery is necessary.  RELATED COMPLICATIONS   Permanent nerve damage, including pain, numbness, tingle, or weakness.  Chronic back pain.  Risks of surgery: infection,  bleeding, nerve damage, or damage to surrounding tissues. TREATMENT Treatment initially involves resting from any activities that aggravate your symptoms. The use of ice and medication may help reduce pain and inflammation. The use of strengthening and stretching exercises may help reduce pain with activity. These exercises may be performed at home or with referral to a therapist. A therapist may recommend further treatments, such as transcutaneous electronic nerve stimulation (TENS) or ultrasound. Your caregiver may recommend corticosteroid injections to help reduce inflammation of the sciatic nerve. If symptoms persist despite non-surgical (conservative) treatment, then surgery may be recommended. MEDICATION  If pain medication is necessary, then nonsteroidal anti-inflammatory medications, such as aspirin and ibuprofen, or other minor pain relievers, such as acetaminophen, are often recommended.  Do not take pain medication for 7 days before surgery.  Prescription pain relievers may be given if deemed necessary by your caregiver. Use only as directed and only as much as you need.  Ointments applied to the skin may be helpful.  Corticosteroid injections may be given by your caregiver. These injections should be reserved for the most serious cases, because they may only be given a certain number of times. HEAT AND COLD  Cold treatment (icing) relieves pain and reduces inflammation. Cold treatment should be applied for 10 to 15 minutes every 2 to 3 hours for inflammation and pain and immediately after any activity that aggravates your symptoms. Use ice packs or massage the area with a piece of ice (ice massage).  Heat treatment may be used prior to performing the stretching and strengthening activities prescribed by your caregiver, physical therapist, or athletic trainer. Use a heat pack or soak the injury in warm water. SEEK   MEDICAL CARE IF:  Treatment seems to offer no benefit, or the condition  worsens.  Any medications produce adverse side effects. EXERCISES  RANGE OF MOTION (ROM) AND STRETCHING EXERCISES - Sciatica Most people with sciatic will find that their symptoms worsen with either excessive bending forward (flexion) or arching at the low back (extension). The exercises which will help resolve your symptoms will focus on the opposite motion. Your physician, physical therapist or athletic trainer will help you determine which exercises will be most helpful to resolve your low back pain. Do not complete any exercises without first consulting with your clinician. Discontinue any exercises which worsen your symptoms until you speak to your clinician. If you have pain, numbness or tingling which travels down into your buttocks, leg or foot, the goal of the therapy is for these symptoms to move closer to your back and eventually resolve. Occasionally, these leg symptoms will get better, but your low back pain may worsen; this is typically an indication of progress in your rehabilitation. Be certain to be very alert to any changes in your symptoms and the activities in which you participated in the 24 hours prior to the change. Sharing this information with your clinician will allow him/her to most efficiently treat your condition. These exercises may help you when beginning to rehabilitate your injury. Your symptoms may resolve with or without further involvement from your physician, physical therapist or athletic trainer. While completing these exercises, remember:   Restoring tissue flexibility helps normal motion to return to the joints. This allows healthier, less painful movement and activity.  An effective stretch should be held for at least 30 seconds.  A stretch should never be painful. You should only feel a gentle lengthening or release in the stretched tissue. FLEXION RANGE OF MOTION AND STRETCHING EXERCISES: STRETCH  Flexion, Single Knee to Chest   Lie on a firm bed or floor  with both legs extended in front of you.  Keeping one leg in contact with the floor, bring your opposite knee to your chest. Hold your leg in place by either grabbing behind your thigh or at your knee.  Pull until you feel a gentle stretch in your low back. Hold __________ seconds.  Slowly release your grasp and repeat the exercise with the opposite side. Repeat __________ times. Complete this exercise __________ times per day.  STRETCH  Flexion, Double Knee to Chest  Lie on a firm bed or floor with both legs extended in front of you.  Keeping one leg in contact with the floor, bring your opposite knee to your chest.  Tense your stomach muscles to support your back and then lift your other knee to your chest. Hold your legs in place by either grabbing behind your thighs or at your knees.  Pull both knees toward your chest until you feel a gentle stretch in your low back. Hold __________ seconds.  Tense your stomach muscles and slowly return one leg at a time to the floor. Repeat __________ times. Complete this exercise __________ times per day.  STRETCH  Low Trunk Rotation   Lie on a firm bed or floor. Keeping your legs in front of you, bend your knees so they are both pointed toward the ceiling and your feet are flat on the floor.  Extend your arms out to the side. This will stabilize your upper body by keeping your shoulders in contact with the floor.  Gently and slowly drop both knees together to one side until you   feel a gentle stretch in your low back. Hold for __________ seconds.  Tense your stomach muscles to support your low back as you bring your knees back to the starting position. Repeat the exercise to the other side. Repeat __________ times. Complete this exercise __________ times per day  EXTENSION RANGE OF MOTION AND FLEXIBILITY EXERCISES: STRETCH  Extension, Prone on Elbows  Lie on your stomach on the floor, a bed will be too soft. Place your palms about shoulder  width apart and at the height of your head.  Place your elbows under your shoulders. If this is too painful, stack pillows under your chest.  Allow your body to relax so that your hips drop lower and make contact more completely with the floor.  Hold this position for __________ seconds.  Slowly return to lying flat on the floor. Repeat __________ times. Complete this exercise __________ times per day.  RANGE OF MOTION  Extension, Prone Press Ups  Lie on your stomach on the floor, a bed will be too soft. Place your palms about shoulder width apart and at the height of your head.  Keeping your back as relaxed as possible, slowly straighten your elbows while keeping your hips on the floor. You may adjust the placement of your hands to maximize your comfort. As you gain motion, your hands will come more underneath your shoulders.  Hold this position __________ seconds.  Slowly return to lying flat on the floor. Repeat __________ times. Complete this exercise __________ times per day.  STRENGTHENING EXERCISES - Sciatica  These exercises may help you when beginning to rehabilitate your injury. These exercises should be done near your "sweet spot." This is the neutral, low-back arch, somewhere between fully rounded and fully arched, that is your least painful position. When performed in this safe range of motion, these exercises can be used for people who have either a flexion or extension based injury. These exercises may resolve your symptoms with or without further involvement from your physician, physical therapist or athletic trainer. While completing these exercises, remember:   Muscles can gain both the endurance and the strength needed for everyday activities through controlled exercises.  Complete these exercises as instructed by your physician, physical therapist or athletic trainer. Progress with the resistance and repetition exercises only as your caregiver advises.  You may  experience muscle soreness or fatigue, but the pain or discomfort you are trying to eliminate should never worsen during these exercises. If this pain does worsen, stop and make certain you are following the directions exactly. If the pain is still present after adjustments, discontinue the exercise until you can discuss the trouble with your clinician. STRENGTHENING Deep Abdominals, Pelvic Tilt   Lie on a firm bed or floor. Keeping your legs in front of you, bend your knees so they are both pointed toward the ceiling and your feet are flat on the floor.  Tense your lower abdominal muscles to press your low back into the floor. This motion will rotate your pelvis so that your tail bone is scooping upwards rather than pointing at your feet or into the floor.  With a gentle tension and even breathing, hold this position for __________ seconds. Repeat __________ times. Complete this exercise __________ times per day.  STRENGTHENING  Abdominals, Crunches   Lie on a firm bed or floor. Keeping your legs in front of you, bend your knees so they are both pointed toward the ceiling and your feet are flat on the floor. Cross   your arms over your chest.  Slightly tip your chin down without bending your neck.  Tense your abdominals and slowly lift your trunk high enough to just clear your shoulder blades. Lifting higher can put excessive stress on the low back and does not further strengthen your abdominal muscles.  Control your return to the starting position. Repeat __________ times. Complete this exercise __________ times per day.  STRENGTHENING  Quadruped, Opposite UE/LE Lift  Assume a hands and knees position on a firm surface. Keep your hands under your shoulders and your knees under your hips. You may place padding under your knees for comfort.  Find your neutral spine and gently tense your abdominal muscles so that you can maintain this position. Your shoulders and hips should form a rectangle that  is parallel with the floor and is not twisted.  Keeping your trunk steady, lift your right hand no higher than your shoulder and then your left leg no higher than your hip. Make sure you are not holding your breath. Hold this position __________ seconds.  Continuing to keep your abdominal muscles tense and your back steady, slowly return to your starting position. Repeat with the opposite arm and leg. Repeat __________ times. Complete this exercise __________ times per day.  STRENGTHENING  Abdominals and Quadriceps, Straight Leg Raise   Lie on a firm bed or floor with both legs extended in front of you.  Keeping one leg in contact with the floor, bend the other knee so that your foot can rest flat on the floor.  Find your neutral spine, and tense your abdominal muscles to maintain your spinal position throughout the exercise.  Slowly lift your straight leg off the floor about 6 inches for a count of 15, making sure to not hold your breath.  Still keeping your neutral spine, slowly lower your leg all the way to the floor. Repeat this exercise with each leg __________ times. Complete this exercise __________ times per day. POSTURE AND BODY MECHANICS CONSIDERATIONS - Sciatica Keeping correct posture when sitting, standing or completing your activities will reduce the stress put on different body tissues, allowing injured tissues a chance to heal and limiting painful experiences. The following are general guidelines for improved posture. Your physician or physical therapist will provide you with any instructions specific to your needs. While reading these guidelines, remember:  The exercises prescribed by your provider will help you have the flexibility and strength to maintain correct postures.  The correct posture provides the optimal environment for your joints to work. All of your joints have less wear and tear when properly supported by a spine with good posture. This means you will  experience a healthier, less painful body.  Correct posture must be practiced with all of your activities, especially prolonged sitting and standing. Correct posture is as important when doing repetitive low-stress activities (typing) as it is when doing a single heavy-load activity (lifting). RESTING POSITIONS Consider which positions are most painful for you when choosing a resting position. If you have pain with flexion-based activities (sitting, bending, stooping, squatting), choose a position that allows you to rest in a less flexed posture. You would want to avoid curling into a fetal position on your side. If your pain worsens with extension-based activities (prolonged standing, working overhead), avoid resting in an extended position such as sleeping on your stomach. Most people will find more comfort when they rest with their spine in a more neutral position, neither too rounded nor too arched. Lying   on a non-sagging bed on your side with a pillow between your knees, or on your back with a pillow under your knees will often provide some relief. Keep in mind, being in any one position for a prolonged period of time, no matter how correct your posture, can still lead to stiffness. PROPER SITTING POSTURE In order to minimize stress and discomfort on your spine, you must sit with correct posture Sitting with good posture should be effortless for a healthy body. Returning to good posture is a gradual process. Many people can work toward this most comfortably by using various supports until they have the flexibility and strength to maintain this posture on their own. When sitting with proper posture, your ears will fall over your shoulders and your shoulders will fall over your hips. You should use the back of the chair to support your upper back. Your low back will be in a neutral position, just slightly arched. You may place a small pillow or folded towel at the base of your low back for support.  When  working at a desk, create an environment that supports good, upright posture. Without extra support, muscles fatigue and lead to excessive strain on joints and other tissues. Keep these recommendations in mind: CHAIR:   A chair should be able to slide under your desk when your back makes contact with the back of the chair. This allows you to work closely.  The chair's height should allow your eyes to be level with the upper part of your monitor and your hands to be slightly lower than your elbows. BODY POSITION  Your feet should make contact with the floor. If this is not possible, use a foot rest.  Keep your ears over your shoulders. This will reduce stress on your neck and low back. INCORRECT SITTING POSTURES   If you are feeling tired and unable to assume a healthy sitting posture, do not slouch or slump. This puts excessive strain on your back tissues, causing more damage and pain. Healthier options include:  Using more support, like a lumbar pillow.  Switching tasks to something that requires you to be upright or walking.  Talking a brief walk.  Lying down to rest in a neutral-spine position. PROLONGED STANDING WHILE SLIGHTLY LEANING FORWARD  When completing a task that requires you to lean forward while standing in one place for a long time, place either foot up on a stationary 2-4 inch high object to help maintain the best posture. When both feet are on the ground, the low back tends to lose its slight inward curve. If this curve flattens (or becomes too large), then the back and your other joints will experience too much stress, fatigue more quickly and can cause pain.  CORRECT STANDING POSTURES Proper standing posture should be assumed with all daily activities, even if they only take a few moments, like when brushing your teeth. As in sitting, your ears should fall over your shoulders and your shoulders should fall over your hips. You should keep a slight tension in your abdominal  muscles to brace your spine. Your tailbone should point down to the ground, not behind your body, resulting in an over-extended swayback posture.  INCORRECT STANDING POSTURES  Common incorrect standing postures include a forward head, locked knees and/or an excessive swayback. WALKING Walk with an upright posture. Your ears, shoulders and hips should all line-up. PROLONGED ACTIVITY IN A FLEXED POSITION When completing a task that requires you to bend forward at your   waist or lean over a low surface, try to find a way to stabilize 3 of 4 of your limbs. You can place a hand or elbow on your thigh or rest a knee on the surface you are reaching across. This will provide you more stability so that your muscles do not fatigue as quickly. By keeping your knees relaxed, or slightly bent, you will also reduce stress across your low back. CORRECT LIFTING TECHNIQUES DO :   Assume a wide stance. This will provide you more stability and the opportunity to get as close as possible to the object which you are lifting.  Tense your abdominals to brace your spine; then bend at the knees and hips. Keeping your back locked in a neutral-spine position, lift using your leg muscles. Lift with your legs, keeping your back straight.  Test the weight of unknown objects before attempting to lift them.  Try to keep your elbows locked down at your sides in order get the best strength from your shoulders when carrying an object.  Always ask for help when lifting heavy or awkward objects. INCORRECT LIFTING TECHNIQUES DO NOT:   Lock your knees when lifting, even if it is a small object.  Bend and twist. Pivot at your feet or move your feet when needing to change directions.  Assume that you cannot safely pick up a paperclip without proper posture. Document Released: 09/11/2005 Document Revised: 12/04/2011 Document Reviewed: 12/24/2008 [employer] Patient Information 2013 [employer]  

## 2013-02-07 NOTE — Progress Notes (Signed)
[address]   [place], [place]  [address]   [phone]  Subjective:    Patient ID: Virginia Larson, female    DOB: 1950-11-25, 62 y.o.   MRN: [id number]  HPI This 62 y.o. female presents for evaluation of the following:  1.  L leg pain:  Worse at night; throbbing; aching.  Located in thigh, knee, and into calf and into toes.  Numbness.  L sided numbness in arm also involved; numbness has resolved but pain still present.  Duration four months.  No injury; +lower back pain.  Weakness in L leg.  No b/b dysfunction.  No saddle paresthesias.  No other care.  Taking Ibuprofen and Aleve.  Heat to area qhs.    2. HTN:  Taking medication daily.  No BP checks at home.  Duration 15-20 years.  No AMI or CVA.  Denies chest pain, SOB but does suffer with intermittent palpitations and leg swelling.    3.  DMII/glucose intolerance:  No blood sugar checks at home.  Feels well.  No medication for glucose intolerance.  4.  Hot Flashes:  At nighttime; stopped HRT patch; too expensive.    Review of Systems  Constitutional: Positive for fatigue. Negative for fever, chills and diaphoresis.  Eyes: Positive for visual disturbance. Negative for photophobia.  Respiratory: Negative for shortness of breath.   Cardiovascular: Positive for palpitations and leg swelling. Negative for chest pain.  Gastrointestinal: Negative for nausea, vomiting, abdominal pain, diarrhea and constipation.  Musculoskeletal: Positive for myalgias and back pain. Negative for joint swelling and arthralgias.  Skin: Negative for rash.  Neurological: Positive for weakness and numbness. Negative for dizziness, tremors, seizures, syncope, speech difficulty, light-headedness and headaches.        Past Medical History  Diagnosis Date  . Hypertension   . Hyperlipidemia     Past Surgical History  Procedure Laterality Date  . Abdominal hysterectomy      fibroids.  Ovaries intact.  . Breast surgery      L lump resection; benign.     Prior to Admission medications   Medication Sig Start Date End Date Taking? Authorizing Provider  estradiol (CLIMARA - DOSED IN MG/24 HR) 0.025 mg/24hr Place 1 patch (0.025 mg total) onto the skin once a week. 08/20/12  Yes Maurice March, MD  hydrochlorothiazide (MICROZIDE) 12.5 MG capsule Take 1 capsule (12.5 mg total) by mouth every morning. 03/26/12   Maurice March, MD    No Known Allergies  History   Social History  . Marital Status: Married    Spouse Name: N/A    Number of Children: N/A  . Years of Education: N/A   Occupational History  . Not on file.   Social History Main Topics  . Smoking status: Never Smoker   . Smokeless tobacco: Not on file  . Alcohol Use: No  . Drug Use: No  . Sexually Active: Yes    Birth Control/ Protection: Post-menopausal   Other Topics Concern  . Not on file   Social History Narrative   Marital: married x 19 years.      Children:  2 sons; 7 grandchildren      Lives:  With husband.      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      Tobacco: none      Alcohol: none       Exercise:  Walking.           Family History  Problem Relation  Age of Onset  . Heart disease Mother   . Dementia Mother   . Stroke Mother   . Asthma Sister   . Hypertension Sister     Objective:   Physical Exam  Nursing note and vitals reviewed. Constitutional: She is oriented to person, place, and time. She appears well-developed and well-nourished. No distress.  HENT:  Head: Normocephalic and atraumatic.  Mouth/Throat: Oropharynx is clear and moist.  Eyes: Conjunctivae and EOM are normal. Pupils are equal, round, and reactive to light.  Neck: Normal range of motion. Neck supple. No thyromegaly present.  Cardiovascular: Normal rate, regular rhythm, normal heart sounds and intact distal pulses.  Exam reveals no gallop and no friction rub.   No murmur heard. Pulmonary/Chest: Effort normal and breath sounds normal. She has no wheezes.  She has no rales.  Abdominal: Soft. Bowel sounds are normal. She exhibits no distension and no mass. There is tenderness. There is no rebound and no guarding.  Musculoskeletal:       Left knee: Normal. She exhibits normal range of motion, no swelling and no effusion. No tenderness found. No medial joint line and no lateral joint line tenderness noted.       Lumbar back: She exhibits normal range of motion, no tenderness, no bony tenderness, no swelling, no pain and no spasm.  LS spine:  Straight leg raises negative; motor 5/5; toe and heel walking intact; marching intact.    Lymphadenopathy:    She has no cervical adenopathy.  Neurological: She is alert and oriented to person, place, and time. No cranial nerve deficit. She exhibits normal muscle tone. Coordination normal.  Skin: Skin is warm and dry. No rash noted. She is not diaphoretic.  Psychiatric: She has a normal mood and affect. Her behavior is normal.   Results for orders placed in visit on 02/07/13  POCT CBC      Result Value Range   WBC 6.9  4.6 - 10.2 K/uL   Lymph, poc 2.2  0.6 - 3.4   POC LYMPH PERCENT 32.6  10 - 50 %L   MID (cbc) 0.4  0 - 0.9   POC MID % 5.7  0 - 12 %M   POC Granulocyte 4.3  2 - 6.9   Granulocyte percent 61.7  37 - 80 %G   RBC 4.20  4.04 - 5.48 M/uL   Hemoglobin 12.9  12.2 - 16.2 g/dL   HCT, POC 08.6  57.8 - 47.9 %   MCV 96.6  80 - 97 fL   MCH, POC 30.7  27 - 31.2 pg   MCHC 31.8  31.8 - 35.4 g/dL   RDW, POC 46.9     Platelet Count, POC 212  142 - 424 K/uL   MPV 8.5  0 - 99.8 fL  POCT GLYCOSYLATED HEMOGLOBIN (HGB A1C)      Result Value Range   Hemoglobin A1C 6.1     [facility] reading (PRIMARY) by  Dr. Katrinka Blazing.  LS SPINE:  +scoliosis; +narrowing L5-S1; +spurring; antererolisthesis.      Assessment & Plan:  Lower back pain - Plan: DG Lumbar Spine 2-3 Views  Essential hypertension, benign - Plan: POCT CBC, Comprehensive metabolic panel  Glucose intolerance (impaired glucose tolerance) - Plan: POCT  glycosylated hemoglobin (Hb A1C)   1.  Lower back pain/sciatica:  New.  Rx for Prednisone, Flexeril; home exercises provided; if no improvement in one month, call for ortho referral. 2.  HTN: controlled; refill provided; obtain labs. 3.  Glucose  Intolerance: stable; continue with dietary modification. 4.  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  Pt declined rx today.    Meds ordered this encounter  Medications  . DISCONTD: hydrochlorothiazide (MICROZIDE) 12.5 MG capsule    Sig: Take 1 capsule (12.5 mg total) by mouth every morning.    Dispense:  90 capsule    Refill:  1  . predniSONE (DELTASONE) 20 MG tablet    Sig: Three tablets daily x 2 days then two tablets daily x 5 days and then one tablet daily x 5 days.    Dispense:  21 tablet    Refill:  0  . cyclobenzaprine (FLEXERIL) 5 MG tablet    Sig: One at bedtime for leg pain    Dispense:  30 tablet    Refill:  0  . hydrochlorothiazide (MICROZIDE) 12.5 MG capsule    Sig: Take 1 capsule (12.5 mg total) by mouth every morning.    Dispense:  90 capsule    Refill:  3

## 2013-02-17 ENCOUNTER — Telehealth: Payer: Self-pay

## 2013-02-17 NOTE — Telephone Encounter (Signed)
Notes Recorded by Ethelda Chick, MD on 02/11/2013 at 5:12 PM Call ---1 . Blood sugar elevated at 134 and three month average of sugars in Prediabetic range. 2. Liver and kidney functions normal. 3. No evidence of anemia  Left message for her to call back see lab.

## 2013-02-17 NOTE — Telephone Encounter (Signed)
Patient would like to get her lab results. Please call her back at [phone]. Thanks

## 2013-03-23 ENCOUNTER — Ambulatory Visit (INDEPENDENT_AMBULATORY_CARE_PROVIDER_SITE_OTHER): Payer: BC Managed Care – PPO | Admitting: Family Medicine

## 2013-03-23 VITALS — BP 136/83 | HR 82 | Temp 98.3°F | Resp 16 | Ht 67.0 in | Wt 190.6 lb

## 2013-03-23 DIAGNOSIS — H9313 Tinnitus, bilateral: Secondary | ICD-10-CM

## 2013-03-23 DIAGNOSIS — M5430 Sciatica, unspecified side: Secondary | ICD-10-CM

## 2013-03-23 DIAGNOSIS — I10 Essential (primary) hypertension: Secondary | ICD-10-CM

## 2013-03-23 DIAGNOSIS — N951 Menopausal and female climacteric states: Secondary | ICD-10-CM

## 2013-03-23 DIAGNOSIS — M5432 Sciatica, left side: Secondary | ICD-10-CM

## 2013-03-23 DIAGNOSIS — H9319 Tinnitus, unspecified ear: Secondary | ICD-10-CM

## 2013-03-23 MED ORDER — CLONIDINE HCL 0.1 MG PO TABS: 0.1000 mg | ORAL_TABLET | Freq: Two times a day (BID) | ORAL | Status: DC

## 2013-03-23 MED ORDER — ESTRADIOL 0.025 MG/24HR TD PTWK: 1.0000 | MEDICATED_PATCH | TRANSDERMAL | Status: DC

## 2013-03-23 NOTE — Patient Instructions (Addendum)
Can restart the hormone patch and new medicine for hot flushes. Keep a record of your blood pressures outside of the office and bring them to the next office visit, and be careful especially with first standing that your blood pressure is not running too low with the clonidine.  If you do have these symptoms - can stop clonidine.  We will refer you to orthopaedics for your leg symptoms, and will check an ultrasound of your carotid arteries for the sound you are hearing in your ears.  Return to the clinic or go to the nearest emergency room if any of your symptoms worsen or new symptoms occur. Tylenol for neck and back pain.  Let me know if something stronger needed.   Hormone Therapy At menopause, your body begins making less estrogen and progesterone hormones. This causes the body to stop having menstrual periods. This is because estrogen and progesterone hormones control your periods and menstrual cycle. A lack of estrogen may cause symptoms such as:  Hot flushes (or hot flashes).  Vaginal dryness.  Dry skin.  Loss of sex drive.  Risk of bone loss (osteoporosis). When this happens, you may choose to take hormone therapy to get back the estrogen lost during menopause. When the hormone estrogen is given alone, it is usually referred to as ET (Estrogen Therapy). When the hormone progestin is combined with estrogen, it is generally called HT (Hormone Therapy). This was formerly known as hormone replacement therapy (HRT). Your caregiver can help you make a decision on what will be best for you. The decision to use HT seems to change often as new studies are done. Many studies do not agree on the benefits of hormone replacement therapy. LIKELY BENEFITS OF HT INCLUDE PROTECTION FROM:  Hot Flushes (also called hot flashes) - A hot flush is a sudden feeling of heat that spreads over the face and body. The skin may redden like a blush. It is connected with sweats and sleep disturbance. Women going through  menopause may have hot flushes a few times a month or several times per day depending on the woman.  Osteoporosis (bone loss)- Estrogen helps guard against bone loss. After menopause, a woman's bones slowly lose calcium and become weak and brittle. As a result, bones are more likely to break. The hip, wrist, and spine are affected most often. Hormone therapy can help slow bone loss after menopause. Weight bearing exercise and taking calcium with vitamin D also can help prevent bone loss. There are also medications that your caregiver can prescribe that can help prevent osteoporosis.  Vaginal Dryness - Loss of estrogen causes changes in the vagina. Its lining may become thin and dry. These changes can cause pain and bleeding during sexual intercourse. Dryness can also lead to infections. This can cause burning and itching. (Vaginal estrogen treatment can help relieve pain, itching, and dryness.)  Urinary Tract Infections are more common after menopause because of lack of estrogen. Some women also develop urinary incontinence because of low estrogen levels in the vagina and bladder.  Possible other benefits of estrogen include a positive effect on mood and short-term memory in women. RISKS AND COMPLICATIONS  Using estrogen alone without progesterone causes the lining of the uterus to grow. This increases the risk of lining of the uterus (endometrial) cancer. Your caregiver should give another hormone called progestin if you have a uterus.  Women who take combined (estrogen and progestin) HT appear to have an increased risk of breast cancer. The risk  appears to be small, but increases throughout the time that HT is taken.  Combined therapy also makes the breast tissue slightly denser which makes it harder to read mammograms (breast X-rays).  Combined, estrogen and progesterone therapy can be taken together every day, in which case there may be spotting of blood. HT therapy can be taken cyclically in  which case you will have menstrual periods. Cyclically means HT is taken for a set amount of days, then not taken, then this process is repeated.  HT may increase the risk of stroke, heart attack, breast cancer and forming blood clots in your leg.  Transdermal estrogen (estrogen that is absorbed through the skin with a patch or a cream) may have more positive results with:  Cholesterol.  Blood pressure.  Blood clots. Having the following conditions may indicate you should not have HT:  Endometrial cancer.  Liver disease.  Breast cancer.  Heart disease.  History of blood clots.  Stroke. TREATMENT   If you choose to take HT and have a uterus, usually estrogen and progestin are prescribed.  Your caregiver will help you decide the best way to take the medications.  Possible ways to take estrogen include:  Pills.  Patches.  Gels.  Sprays.  Vaginal estrogen cream, rings and tablets.  It is best to take the lowest dose possible that will help your symptoms and take them for the shortest period of time that you can.  Hormone therapy can help relieve some of the problems (symptoms) that affect women at menopause. Before making a decision about HT, talk to your caregiver about what is best for you. Be well informed and comfortable with your decisions. HOME CARE INSTRUCTIONS   Follow your caregivers advice when taking the medications.  A Pap test is done to screen for cervical cancer.  The first Pap test should be done at age 80.  Between ages 46 and 33, Pap tests are repeated every 2 years.  Beginning at age 67, you are advised to have a Pap test every 3 years as long as your past 3 Pap tests have been normal.  Some women have medical problems that increase the chance of getting cervical cancer. Talk to your caregiver about these problems. It is especially important to talk to your caregiver if a new problem develops soon after your last Pap test. In these cases, your  caregiver may recommend more frequent screening and Pap tests.  The above recommendations are the same for women who have or have not gotten the vaccine for HPV (Human Papillomavirus).  If you had a hysterectomy for a problem that was not a cancer or a condition that could lead to cancer, then you no longer need Pap tests. However, even if you no longer need a Pap test, a regular exam is a good idea to make sure no other problems are starting.   If you are between ages 57 and 8, and you have had normal Pap tests going back 10 years, you no longer need Pap tests. However, even if you no longer need a Pap test, a regular exam is a good idea to make sure no other problems are starting.   If you have had past treatment for cervical cancer or a condition that could lead to cancer, you need Pap tests and screening for cancer for at least 20 years after your treatment.  If Pap tests have been discontinued, risk factors (such as a new sexual partner) need to be re-assessed to  determine if screening should be resumed.  Some women may need screenings more often if they are at high risk for cervical cancer.  Get mammograms done as per the advice of your caregiver. SEEK IMMEDIATE MEDICAL CARE IF:  You develop abnormal vaginal bleeding.  You have pain or swelling in your legs, shortness of breath, or chest pain.  You develop dizziness or headaches.  You have lumps or changes in your breasts or armpits.  You have slurred speech.  You develop weakness or numbness of your arms or legs.  You have pain, burning, or bleeding when urinating.  You develop abdominal pain. Document Released: 06/10/2003 Document Revised: 12/04/2011 Document Reviewed: 09/28/2010 [facility] Patient Information 2014 [place], [place].

## 2013-03-23 NOTE — Progress Notes (Signed)
Subjective:    Patient ID: Virginia Larson, female    DOB: Dec 03, 1950, 62 y.o.   MRN: [id number]  HPI Virginia Larson is a 62 y.o. female  Seen 02/07/13 visit with Dr. Katrinka Blazing.  Still having  Hot flushes up to 14-19 times per day.  Declined medicines last ov. Prior on low dose estrogen.  Has headaches at times past 2 weeks - with hot flushes at times. Taking ibuprofen, alleve.  No chest pain,  Tightness in R frontal shoulder area on and off for past 2 days.  No heart palpitations, but with hot flashes hears heartbeat in ears, worried about these symptoms. Numbness down arms into both hands for years.  No lightheadedness or dizziness, but headaches for past 2 weeks.   Menopause in 30's with partial hysterectomy.  Has been on HRT for 30 years, with persistent hot flushes, but stopped this year.  Worse hot flushes off HRT.  Still having left leg symptoms. Rx for prednisone and flexeril last ov - took both of these meds.  Still having pain into L leg.  Still sore to sit in chair and lying down.  Numbness into L leg. Pain in back and going down left leg. No bowel or bladder incontinence, no saddle anesthesia, no lower extremity weakness. Tx: otc ibuprofen. Heat or ice.    XR report from last ov - IMPRESSION:  1. 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  2. 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  3. Grade 1 (measuring 8 mm) anterolisthesis of L4 upon L5.    Review of Systems  Constitutional: Negative for fever and chills.  HENT: Positive for tinnitus.   Cardiovascular: Negative for chest pain and palpitations.  Endocrine:       Flushes as above.   Musculoskeletal: Positive for back pain and arthralgias.       Objective:   Physical Exam  Vitals reviewed. Constitutional: She is oriented to person, place, and time. She appears well-developed and well-nourished.  HENT:  Head: Normocephalic and atraumatic.  Eyes: Conjunctivae and EOM are normal. Pupils  are equal, round, and reactive to light.  Neck: Carotid bruit is not present.  Cardiovascular: Normal rate, regular rhythm, normal heart sounds and intact distal pulses.   Pulmonary/Chest: Effort normal and breath sounds normal.  Abdominal: Soft. She exhibits no pulsatile midline mass. There is no tenderness.  Musculoskeletal:       Lumbar back: She exhibits tenderness (l sciatic notch. pain with SLR on L. ). She exhibits normal range of motion (from, but guarded slighlty with forward flexion. ).  Neurological: She is alert and oriented to person, place, and time.  Skin: Skin is warm and dry.  Psychiatric: She has a normal mood and affect. Her behavior is normal.          Assessment & Plan:  [name] is a 62 y.o. female Tinnitus, bilateral - hx of HTN  - Plan: Carotid duplex ordered, but unlikely true vascular tinniitus as intermittent only.   Sciatica neuralgia, left  - persistent, degenerative anterolistheis on prior LS spine XR.  Plan: AMB referral to orthopedics as no relief with prednisone, and flexeril.  Rtc/er precautions if worsening sooner.   Menopausal hot flushes - Plan: estradiol (CLIMARA - DOSED IN MG/24 HR) 0.025 mg/24hr, cloNIDine (CATAPRES) 0.1 MG tablet.  Options discussed.  Chooses to return to HRT, risks discussed and h/o provided.  Specifically cardiac risks discussed, especially with FH heart disease, and hx  HTN. Would try to remain at lowest effective dose as short time as possible.  Will also try clonidine BID - orthostatic precautions discussed.   recheck in next 2-4 weeks for assessment of control of hot flushes.    Hx of neck pain and arthritis by hx, with intermittent dysesthesias to hands  Longstanding. Tylenol otc, recheck to discuss further. Sooner if worse.   Meds ordered this encounter  Medications  . DISCONTD: estradiol (CLIMARA - DOSED IN MG/24 HR) 0.025 mg/24hr    Sig: Place 1 patch (0.025 mg total) onto the skin once a week.    Dispense:   4 patch    Refill:  3  . DISCONTD: cloNIDine (CATAPRES) 0.1 MG tablet    Sig: Take 1 tablet (0.1 mg total) by mouth 2 (two) times daily.    Dispense:  60 tablet    Refill:  1  . cloNIDine (CATAPRES) 0.1 MG tablet    Sig: Take 1 tablet (0.1 mg total) by mouth 2 (two) times daily.    Dispense:  60 tablet    Refill:  1  . estradiol (CLIMARA - DOSED IN MG/24 HR) 0.025 mg/24hr    Sig: Place 1 patch (0.025 mg total) onto the skin once a week.    Dispense:  4 patch    Refill:  3  reprinted rx's due to pharmacy being closed.   Patient Instructions  Can restart the hormone patch and new medicine for hot flushes. Keep a record of your blood pressures outside of the office and bring them to the next office visit, and be careful especially with first standing that your blood pressure is not running too low with the clonidine.  If you do have these symptoms - can stop clonidine.  We will refer you to orthopaedics for your leg symptoms, and will check an ultrasound of your carotid arteries for the sound you are hearing in your ears.  Return to the clinic or go to the nearest emergency room if any of your symptoms worsen or new symptoms occur. Tylenol for neck and back pain.  Let me know if something stronger needed.   Hormone Therapy At menopause, your body begins making less estrogen and progesterone hormones. This causes the body to stop having menstrual periods. This is because estrogen and progesterone hormones control your periods and menstrual cycle. A lack of estrogen may cause symptoms such as:  Hot flushes (or hot flashes).  Vaginal dryness.  Dry skin.  Loss of sex drive.  Risk of bone loss (osteoporosis). When this happens, you may choose to take hormone therapy to get back the estrogen lost during menopause. When the hormone estrogen is given alone, it is usually referred to as ET (Estrogen Therapy). When the hormone progestin is combined with estrogen, it is generally called HT (Hormone  Therapy). This was formerly known as hormone replacement therapy (HRT). Your caregiver can help you make a decision on what will be best for you. The decision to use HT seems to change often as new studies are done. Many studies do not agree on the benefits of hormone replacement therapy. LIKELY BENEFITS OF HT INCLUDE PROTECTION FROM:  Hot Flushes (also called hot flashes) - A hot flush is a sudden feeling of heat that spreads over the face and body. The skin may redden like a blush. It is connected with sweats and sleep disturbance. Women going through menopause may have hot flushes a few times a month or several times per day depending  on the woman.  Osteoporosis (bone loss)- Estrogen helps guard against bone loss. After menopause, a woman's bones slowly lose calcium and become weak and brittle. As a result, bones are more likely to break. The hip, wrist, and spine are affected most often. Hormone therapy can help slow bone loss after menopause. Weight bearing exercise and taking calcium with vitamin D also can help prevent bone loss. There are also medications that your caregiver can prescribe that can help prevent osteoporosis.  Vaginal Dryness - Loss of estrogen causes changes in the vagina. Its lining may become thin and dry. These changes can cause pain and bleeding during sexual intercourse. Dryness can also lead to infections. This can cause burning and itching. (Vaginal estrogen treatment can help relieve pain, itching, and dryness.)  Urinary Tract Infections are more common after menopause because of lack of estrogen. Some women also develop urinary incontinence because of low estrogen levels in the vagina and bladder.  Possible other benefits of estrogen include a positive effect on mood and short-term memory in women. RISKS AND COMPLICATIONS  Using estrogen alone without progesterone causes the lining of the uterus to grow. This increases the risk of lining of the uterus (endometrial)  cancer. Your caregiver should give another hormone called progestin if you have a uterus.  Women who take combined (estrogen and progestin) HT appear to have an increased risk of breast cancer. The risk appears to be small, but increases throughout the time that HT is taken.  Combined therapy also makes the breast tissue slightly denser which makes it harder to read mammograms (breast X-rays).  Combined, estrogen and progesterone therapy can be taken together every day, in which case there may be spotting of blood. HT therapy can be taken cyclically in which case you will have menstrual periods. Cyclically means HT is taken for a set amount of days, then not taken, then this process is repeated.  HT may increase the risk of stroke, heart attack, breast cancer and forming blood clots in your leg.  Transdermal estrogen (estrogen that is absorbed through the skin with a patch or a cream) may have more positive results with:  Cholesterol.  Blood pressure.  Blood clots. Having the following conditions may indicate you should not have HT:  Endometrial cancer.  Liver disease.  Breast cancer.  Heart disease.  History of blood clots.  Stroke. TREATMENT   If you choose to take HT and have a uterus, usually estrogen and progestin are prescribed.  Your caregiver will help you decide the best way to take the medications.  Possible ways to take estrogen include:  Pills.  Patches.  Gels.  Sprays.  Vaginal estrogen cream, rings and tablets.  It is best to take the lowest dose possible that will help your symptoms and take them for the shortest period of time that you can.  Hormone therapy can help relieve some of the problems (symptoms) that affect women at menopause. Before making a decision about HT, talk to your caregiver about what is best for you. Be well informed and comfortable with your decisions. HOME CARE INSTRUCTIONS   Follow your caregivers advice when taking the  medications.  A Pap test is done to screen for cervical cancer.  The first Pap test should be done at age 4.  Between ages 33 and 20, Pap tests are repeated every 2 years.  Beginning at age 57, you are advised to have a Pap test every 3 years as long as your past 3  Pap tests have been normal.  Some women have medical problems that increase the chance of getting cervical cancer. Talk to your caregiver about these problems. It is especially important to talk to your caregiver if a new problem develops soon after your last Pap test. In these cases, your caregiver may recommend more frequent screening and Pap tests.  The above recommendations are the same for women who have or have not gotten the vaccine for HPV (Human Papillomavirus).  If you had a hysterectomy for a problem that was not a cancer or a condition that could lead to cancer, then you no longer need Pap tests. However, even if you no longer need a Pap test, a regular exam is a good idea to make sure no other problems are starting.   If you are between ages 32 and 110, and you have had normal Pap tests going back 10 years, you no longer need Pap tests. However, even if you no longer need a Pap test, a regular exam is a good idea to make sure no other problems are starting.   If you have had past treatment for cervical cancer or a condition that could lead to cancer, you need Pap tests and screening for cancer for at least 20 years after your treatment.  If Pap tests have been discontinued, risk factors (such as a new sexual partner) need to be re-assessed to determine if screening should be resumed.  Some women may need screenings more often if they are at high risk for cervical cancer.  Get mammograms done as per the advice of your caregiver. SEEK IMMEDIATE MEDICAL CARE IF:  You develop abnormal vaginal bleeding.  You have pain or swelling in your legs, shortness of breath, or chest pain.  You develop dizziness or  headaches.  You have lumps or changes in your breasts or armpits.  You have slurred speech.  You develop weakness or numbness of your arms or legs.  You have pain, burning, or bleeding when urinating.  You develop abdominal pain. Document Released: 06/10/2003 Document Revised: 12/04/2011 Document Reviewed: 09/28/2010 [facility] Patient Information 2014 [place], [place].

## 2013-03-24 ENCOUNTER — Telehealth: Payer: Self-pay | Admitting: *Deleted

## 2013-03-24 NOTE — Telephone Encounter (Signed)
Patient advised.

## 2013-03-24 NOTE — Telephone Encounter (Signed)
Noted.  Please advise her that if she continues to hear the heart sounds in her ears with hot flushes, or especially if any increased frequency of these symptoms, or dizziness, would strongly recommend the carotid study to make sure there is no blockage.  If these symptoms are improving - can discuss further at follow up.

## 2013-03-24 NOTE — Telephone Encounter (Signed)
Dr. Neva Seat ordered Carotid Duplex for Ms. Virginia Larson. Patient can not afford test at this time. She will call Dr. Neva Seat when she is ready to schedule test. I will forward message to Dr. Neva Seat.

## 2013-03-27 ENCOUNTER — Ambulatory Visit
Admission: RE | Admit: 2013-03-27 | Discharge: 2013-03-27 | Disposition: A | Discharge status: Home / Self Care | Payer: BC Managed Care – PPO | Source: Ambulatory Visit

## 2013-03-27 DIAGNOSIS — Z1231 Encounter for screening mammogram for malignant neoplasm of breast: Secondary | ICD-10-CM

## 2013-05-23 ENCOUNTER — Ambulatory Visit (INDEPENDENT_AMBULATORY_CARE_PROVIDER_SITE_OTHER): Payer: BC Managed Care – PPO | Admitting: Family Medicine

## 2013-05-23 ENCOUNTER — Other Ambulatory Visit: Payer: Self-pay | Admitting: Family Medicine

## 2013-05-23 ENCOUNTER — Ambulatory Visit
Admission: RE | Admit: 2013-05-23 | Discharge: 2013-05-23 | Disposition: A | Discharge status: Home / Self Care | Payer: BC Managed Care – PPO | Source: Ambulatory Visit | Attending: Family Medicine | Admitting: Family Medicine

## 2013-05-23 VITALS — BP 120/82 | HR 78 | Temp 97.5°F | Resp 20 | Ht 67.5 in | Wt 194.0 lb

## 2013-05-23 DIAGNOSIS — L659 Nonscarring hair loss, unspecified: Secondary | ICD-10-CM

## 2013-05-23 DIAGNOSIS — J019 Acute sinusitis, unspecified: Secondary | ICD-10-CM

## 2013-05-23 DIAGNOSIS — R232 Flushing: Secondary | ICD-10-CM

## 2013-05-23 DIAGNOSIS — J9801 Acute bronchospasm: Secondary | ICD-10-CM

## 2013-05-23 DIAGNOSIS — E041 Nontoxic single thyroid nodule: Secondary | ICD-10-CM

## 2013-05-23 DIAGNOSIS — N951 Menopausal and female climacteric states: Secondary | ICD-10-CM

## 2013-05-23 IMAGING — US US SOFT TISSUE HEAD/NECK
1 series · 13 of 25 positions shown · non-contrast
Comparison: None.

CLINICAL DATA: Thyroid nodule felt on physical examination.

EXAM:
THYROID ULTRASOUND
TECHNIQUE: Ultrasound examination of the thyroid gland and adjacent soft
tissues was performed.

[Series 1: us soft tissue head/neck · 0.05mm/px · 13 of 89 slices shown]
[im 1/89]
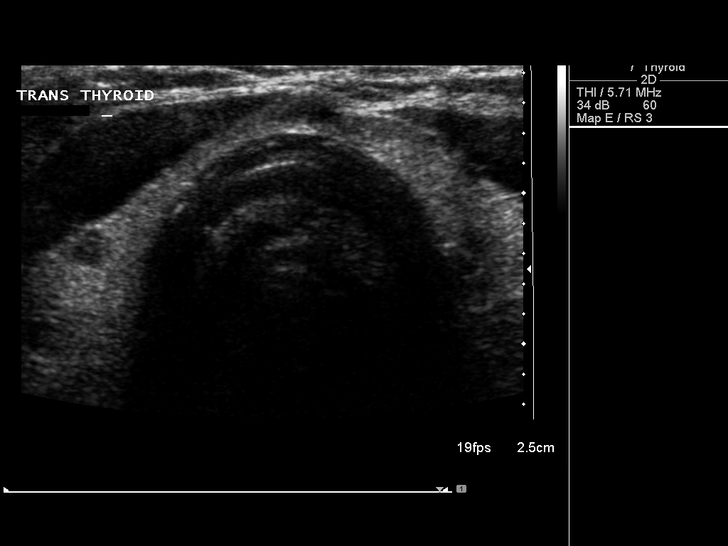
[im 8/89]
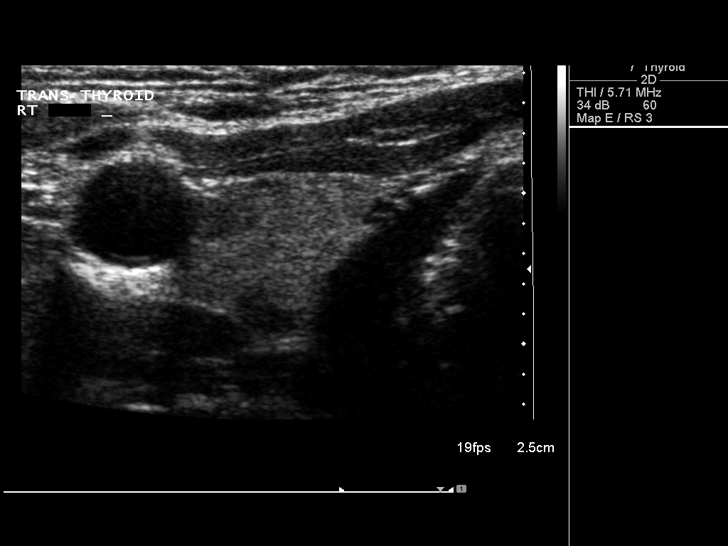
[im 15/89]
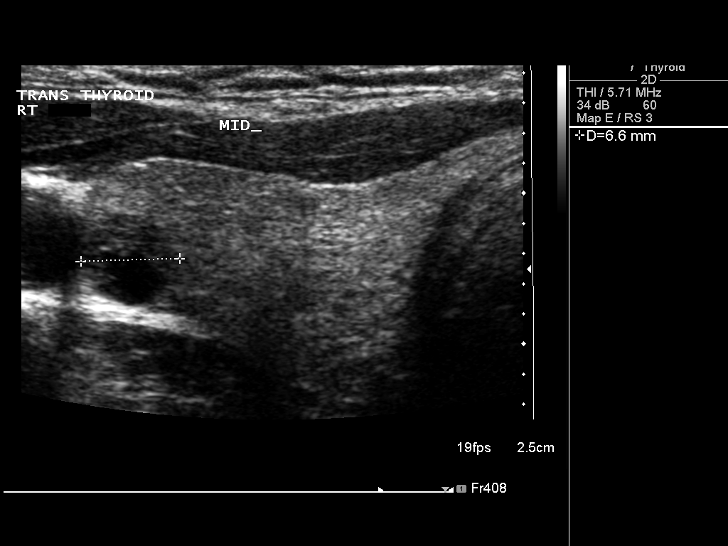
[im 23/89]
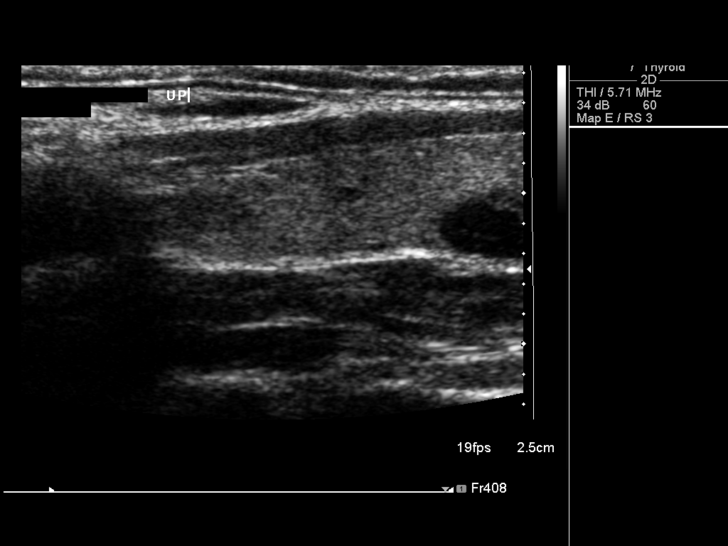
[im 30/89]
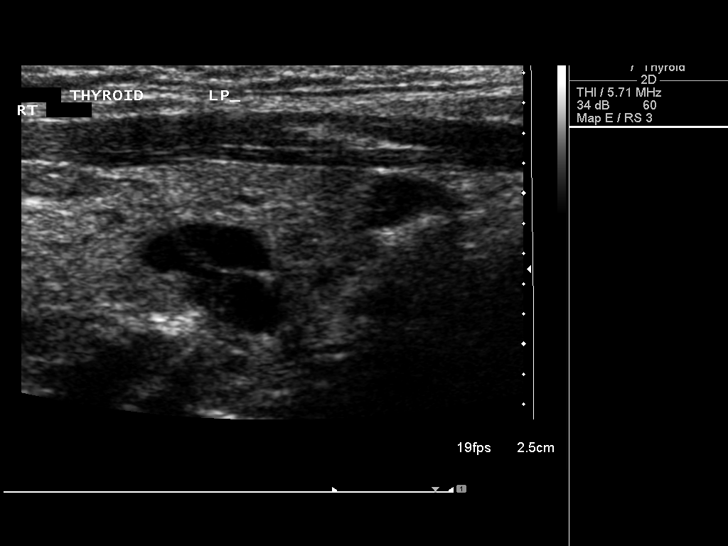
[im 37/89]
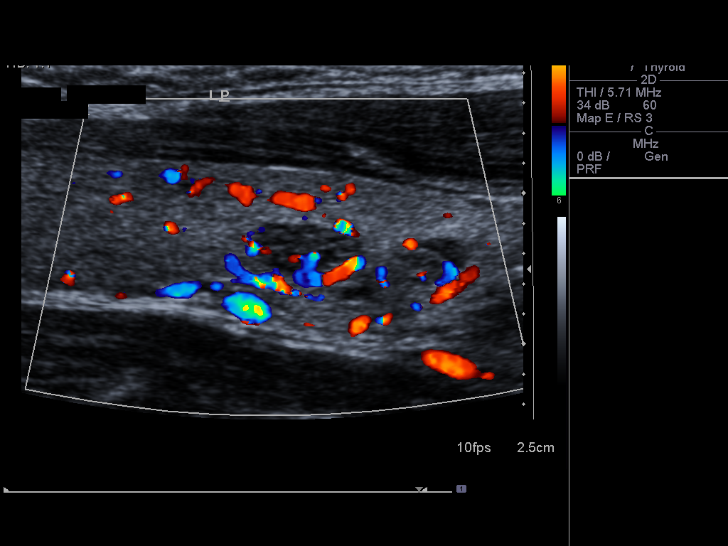
[im 45/89]
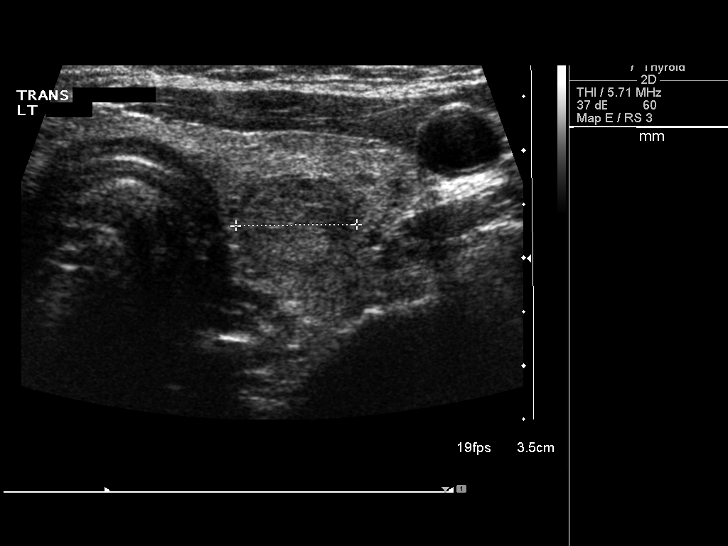
[im 52/89]
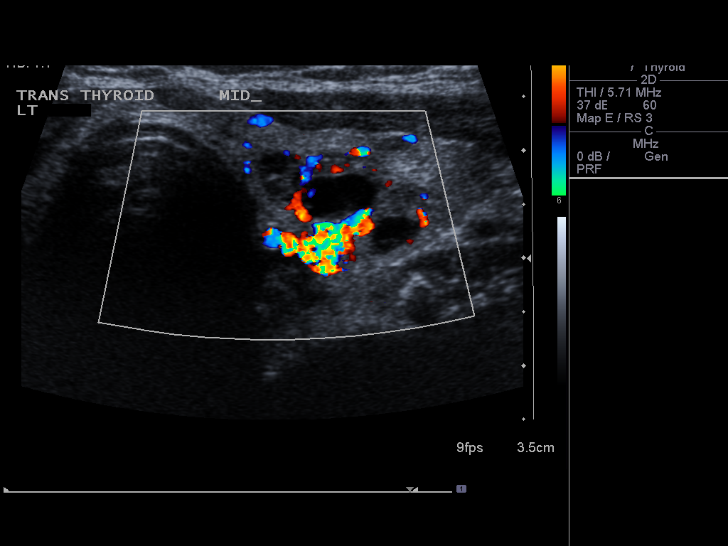
[im 59/89]
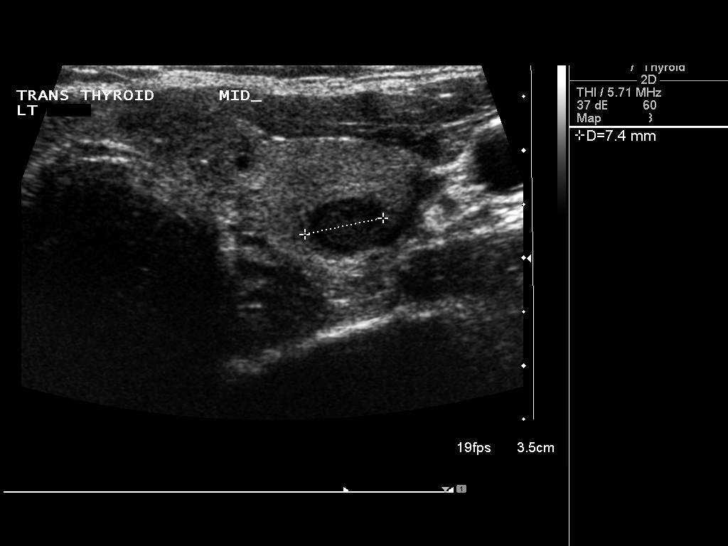
[im 67/89]
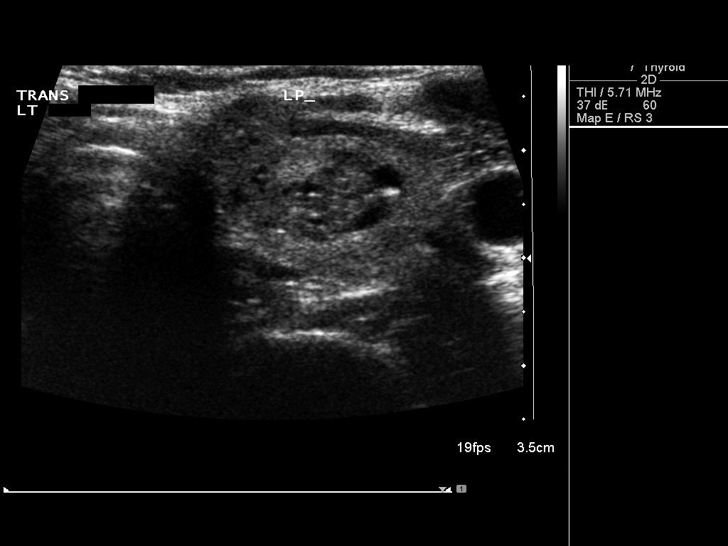
[im 74/89]
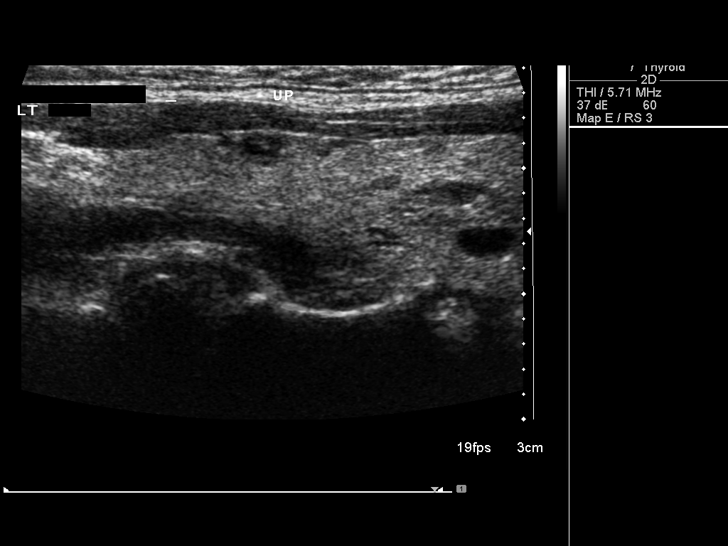
[im 81/89]
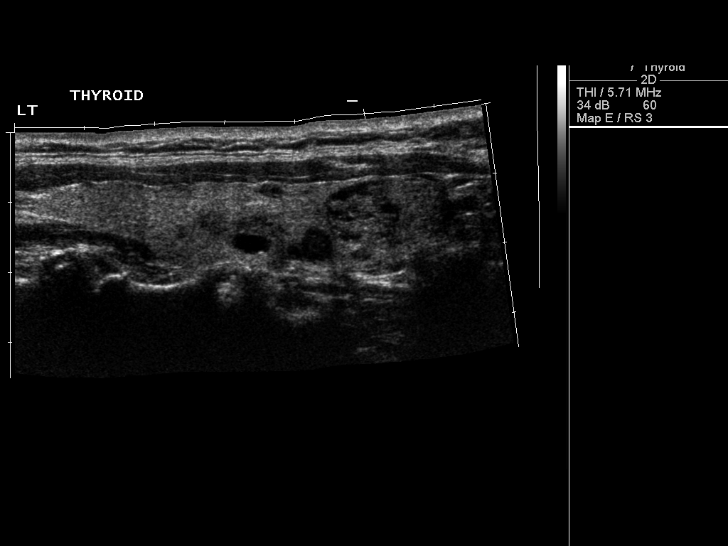
[im 89/89]
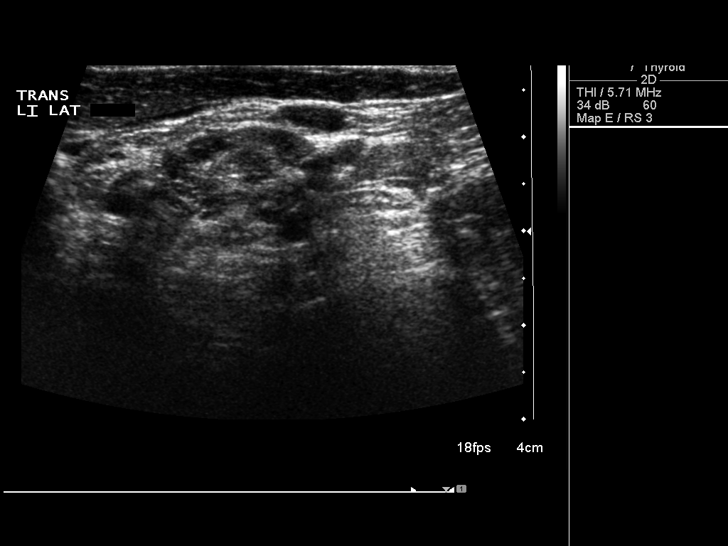

[13 of 25 positions shown; findings below may reference images not displayed]

FINDINGS: Right thyroid lobe

Measurements: 6.4 x 1.2 x 2.2 cm. The gland is diffusely
heterogeneous. There are multiple small cystic and solid nodules.
The largest lesion is a septated cyst inferiorly measuring 11 x 7 x
8 mm.

Left thyroid lobe

Measurements: 6.1 x 1.5 x 2.3 cm. There is a solid nodule in the
middle lobe with blood flow which measures 1.6 x 1.2 x 1.1 cm. There
is a mixed solid and cystic lesion inferiorly which measures 1.9 x
1.2 x 1.6 cm. There is an adjacent primarily cystic lesion measuring
1.5 x 0.9 x 1.7 cm.

Isthmus

Thickness: 2.5 mm. Small hypoechoic nodules are noted.

Lymphadenopathy

None visualized.
IMPRESSION: The thyroid gland is diffusely heterogeneous with multiple solid and
cystic lesions bilaterally. The largest solid lesion in the mid left
lobe measures up to 1.6 cm in diameter. Findings meet consensus
criteria for biopsy. Ultrasound-guided fine needle aspiration should
be considered, as per the consensus statement: Management of Thyroid
Nodules Detected at US: Society of [employer] in Ultrasound
Alternatively, given the multiple nodules, continued clinical
followup and ultrasound followup in 6-12 months could be performed.

## 2013-05-23 MED ORDER — CLONIDINE HCL 0.1 MG PO TABS: 0.1000 mg | ORAL_TABLET | Freq: Two times a day (BID) | ORAL | Status: DC

## 2013-05-23 MED ORDER — AMOXICILLIN 500 MG PO CAPS: 1000.0000 mg | ORAL_CAPSULE | Freq: Three times a day (TID) | ORAL | Status: DC

## 2013-05-23 MED ORDER — ESTRADIOL 0.025 MG/24HR TD PTWK: 1.0000 | MEDICATED_PATCH | TRANSDERMAL | Status: DC

## 2013-05-23 MED ORDER — ALBUTEROL SULFATE HFA 108 (90 BASE) MCG/ACT IN AERS: 2.0000 | INHALATION_SPRAY | Freq: Four times a day (QID) | RESPIRATORY_TRACT | Status: DC | PRN

## 2013-05-23 NOTE — Progress Notes (Addendum)
Subjective:    Patient ID: Virginia Larson, female    DOB: [birth year], 62 y.o.   MRN: [id number]  HPI Virginia Larson is a 62 y.o. female  Nasal congestion, sinus pressure - persistent past 4 weeks, sore throat. Hot and cold, but no measured fever. Nasal/face congestion on right worst symptom - with discolored congestion. Initially chest symptoms, with occasional wheeze - has inhaler if needed, but was in [place], and not able to use it as it was here. Wheeze with chest colds, not using outside of cold sx's.    Tx: multiple otc sinus medicine treatments - no relief.   Hot flushes - Menopausal hot flushes restarted Climara 03/23/13, and clonidine if needed. , risks discussed and h/o provided that ov, with plan to remain at lowest effective dose as short time as possible. Less flushes - about 1/2 of what she had prior, and less severe. Taking clonidine 0.1mg  BID. Feels like this is causing her hair to fall out.  Noticed hair coming out over the years. More brittle, feels like more frequently past few months. Occasional heat and cold intolerance. Lost and gain weight - fluctuaes. Weight 209 in January then 190 in June. Trying lo lose weight with activity. TSH 0.835 in 03/2012.   Leaving tomorrow for [place], [place].  - husband lives there, trying to stay with him. Plans on returning in 1 month to sell house, and moving to [place].   Results for orders placed in visit on 02/07/13  COMPREHENSIVE METABOLIC PANEL      Result Value Range   Sodium 141  135 - 145 mEq/L   Potassium 4.3  3.5 - 5.3 mEq/L   Chloride 104  96 - 112 mEq/L   CO2 29  19 - 32 mEq/L   Glucose, Bld 134 (*) 70 - 99 mg/dL   BUN 16  6 - 23 mg/dL   Creat 6.21  3.08 - 6.57 mg/dL   Total Bilirubin 0.6  0.3 - 1.2 mg/dL   Alkaline Phosphatase 89  39 - 117 U/L   AST 13  0 - 37 U/L   ALT 10  0 - 35 U/L   Total Protein 7.1  6.0 - 8.3 g/dL   Albumin 4.3  3.5 - 5.2 g/dL   Calcium 84.6  8.4 - 96.2 mg/dL  POCT CBC      Result Value  Range   WBC 6.9  4.6 - 10.2 K/uL   Lymph, poc 2.2  0.6 - 3.4   POC LYMPH PERCENT 32.6  10 - 50 %L   MID (cbc) 0.4  0 - 0.9   POC MID % 5.7  0 - 12 %M   POC Granulocyte 4.3  2 - 6.9   Granulocyte percent 61.7  37 - 80 %G   RBC 4.20  4.04 - 5.48 M/uL   Hemoglobin 12.9  12.2 - 16.2 g/dL   HCT, POC 95.2  84.1 - 47.9 %   MCV 96.6  80 - 97 fL   MCH, POC 30.7  27 - 31.2 pg   MCHC 31.8  31.8 - 35.4 g/dL   RDW, POC 32.4     Platelet Count, POC 212  142 - 424 K/uL   MPV 8.5  0 - 99.8 fL  POCT GLYCOSYLATED HEMOGLOBIN (HGB A1C)      Result Value Range   Hemoglobin A1C 6.1       Review of Systems  Constitutional: Negative for fever, chills and unexpected weight change.  HENT: Positive for congestion, rhinorrhea and sinus pressure.   Respiratory: Positive for cough and wheezing (occasional.).   Endocrine: Positive for cold intolerance and heat intolerance.  Skin: Negative for rash.   As above.     Objective:   Physical Exam  Vitals reviewed. Constitutional: She is oriented to person, place, and time. She appears well-developed and well-nourished. No distress.  HENT:  Head: Normocephalic and atraumatic.  Right Ear: Hearing, tympanic membrane, external ear and ear canal normal.  Left Ear: Hearing, tympanic membrane, external ear and ear canal normal.  Nose: Nose normal. Right sinus exhibits no maxillary sinus tenderness and no frontal sinus tenderness. Left sinus exhibits no maxillary sinus tenderness and no frontal sinus tenderness.  Mouth/Throat: Oropharynx is clear and moist. No oropharyngeal exudate.  Eyes: Conjunctivae and EOM are normal. Pupils are equal, round, and reactive to light.  Neck: Neck supple.    Cardiovascular: Normal rate, regular rhythm, normal heart sounds and intact distal pulses.   No murmur heard. Pulmonary/Chest: Effort normal and breath sounds normal. No respiratory distress. She has no wheezes. She has no rhonchi.  Lymphadenopathy:    She has no cervical  adenopathy.  Neurological: She is alert and oriented to person, place, and time.  Skin: Skin is warm and dry. No rash noted.  Thin hair of scalp, but no missing patches. No rash.   Psychiatric: She has a normal mood and affect. Her behavior is normal.       Assessment & Plan:  [name] is a 62 y.o. female  Thyroid nodule, hair loss, Hot flashes - Plan: US Soft Tissue Head/Neck, TSH rechecked as possible thyroid nodule. May need to have followed up in [place].   Menopausal hot flushes - Plan: estradiol (CLIMARA - DOSED IN MG/24 HR) 0.025 mg/24hr patch, cloNIDine (CATAPRES) 0.1 MG tablet - continue same dose for now, but if thyroid workup negative, may need higher dose for improved control.   Sinusitis, acute - Plan: amoxicillin (AMOXIL) 500 MG capsule, saline ns, sx care reviewed.   Bronchospasm - Plan: albuterol (PROVENTIL HFA;VENTOLIN HFA) 108 (90 BASE) MCG/ACT inhaler - only with illness, prn use discussed, but if persistent or frequent use to rtc for eval.   Meds ordered this encounter  Medications  . estradiol (CLIMARA - DOSED IN MG/24 HR) 0.025 mg/24hr patch    Sig: Place 1 patch (0.025 mg total) onto the skin once a week.    Dispense:  4 patch    Refill:  6  . cloNIDine (CATAPRES) 0.1 MG tablet    Sig: Take 1 tablet (0.1 mg total) by mouth 2 (two) times daily.    Dispense:  60 tablet    Refill:  3  . amoxicillin (AMOXIL) 500 MG capsule    Sig: Take 2 capsules (1,000 mg total) by mouth 3 (three) times daily.    Dispense:  60 capsule    Refill:  0  . albuterol (PROVENTIL HFA;VENTOLIN HFA) 108 (90 BASE) MCG/ACT inhaler    Sig: Inhale 2 puffs into the lungs every 6 (six) hours as needed for wheezing.    Dispense:  1 Inhaler    Refill:  0     Patient Instructions  Start amoxicillin for sinus infection. Albuterol only if needed for wheezing. Be evaluated if needing this inhaler frequently - more than 2 times per day, or more than 3 days of use.  You should receive  a call or letter about your lab results within the next week to  10 days, ultrasound today at 1pm - [address], [employer].  Continue Climara and clonidine for hot flushes.  Establish primary care provider in [place] to follow up your medicines in the next 6 months.  Return to the clinic or go to the nearest emergency room if any of your symptoms worsen or new symptoms occur.  Sinusitis Sinusitis is redness, soreness, and swelling (inflammation) of the paranasal sinuses. Paranasal sinuses are air pockets within the bones of your face (beneath the eyes, the middle of the forehead, or above the eyes). In healthy paranasal sinuses, mucus is able to drain out, and air is able to circulate through them by way of your nose. However, when your paranasal sinuses are inflamed, mucus and air can become trapped. This can allow bacteria and other germs to grow and cause infection. Sinusitis can develop quickly and last only a short time (acute) or continue over a long period (chronic). Sinusitis that lasts for more than 12 weeks is considered chronic.  CAUSES  Causes of sinusitis include:  Allergies.  Structural abnormalities, such as displacement of the cartilage that separates your nostrils (deviated septum), which can decrease the air flow through your nose and sinuses and affect sinus drainage.  Functional abnormalities, such as when the small hairs (cilia) that line your sinuses and help remove mucus do not work properly or are not present. SYMPTOMS  Symptoms of acute and chronic sinusitis are the same. The primary symptoms are pain and pressure around the affected sinuses. Other symptoms include:  Upper toothache.  Earache.  Headache.  Bad breath.  Decreased sense of smell and taste.  A cough, which worsens when you are lying flat.  Fatigue.  Fever.  Thick drainage from your nose, which often is green and may contain pus (purulent).  Swelling and warmth over the affected  sinuses. DIAGNOSIS  Your caregiver will perform a physical exam. During the exam, your caregiver may:  Look in your nose for signs of abnormal growths in your nostrils (nasal polyps).  Tap over the affected sinus to check for signs of infection.  View the inside of your sinuses (endoscopy) with a special imaging device with a light attached (endoscope), which is inserted into your sinuses. If your caregiver suspects that you have chronic sinusitis, one or more of the following tests may be recommended:  Allergy tests.  Nasal culture A sample of mucus is taken from your nose and sent to a lab and screened for bacteria.  Nasal cytology A sample of mucus is taken from your nose and examined by your caregiver to determine if your sinusitis is related to an allergy. TREATMENT  Most cases of acute sinusitis are related to a viral infection and will resolve on their own within 10 days. Sometimes medicines are prescribed to help relieve symptoms (pain medicine, decongestants, nasal steroid sprays, or saline sprays).  However, for sinusitis related to a bacterial infection, your caregiver will prescribe antibiotic medicines. These are medicines that will help kill the bacteria causing the infection.  Rarely, sinusitis is caused by a fungal infection. In theses cases, your caregiver will prescribe antifungal medicine. For some cases of chronic sinusitis, surgery is needed. Generally, these are cases in which sinusitis recurs more than 3 times per year, despite other treatments. HOME CARE INSTRUCTIONS   Drink plenty of water. Water helps thin the mucus so your sinuses can drain more easily.  Use a humidifier.  Inhale steam 3 to 4 times a day (for example, sit  in the bathroom with the shower running).  Apply a warm, moist washcloth to your face 3 to 4 times a day, or as directed by your caregiver.  Use saline nasal sprays to help moisten and clean your sinuses.  Take over-the-counter or  prescription medicines for pain, discomfort, or fever only as directed by your caregiver. SEEK IMMEDIATE MEDICAL CARE IF:  You have increasing pain or severe headaches.  You have nausea, vomiting, or drowsiness.  You have swelling around your face.  You have vision problems.  You have a stiff neck.  You have difficulty breathing. MAKE SURE YOU:   Understand these instructions.  Will watch your condition.  Will get help right away if you are not doing well or get worse. Document Released: 09/11/2005 Document Revised: 12/04/2011 Document Reviewed: 09/26/2011 [facility] Patient Information 2014 [place], [place].

## 2013-05-23 NOTE — Patient Instructions (Addendum)
Start amoxicillin for sinus infection. Albuterol only if needed for wheezing. Be evaluated if needing this inhaler frequently - more than 2 times per day, or more than 3 days of use.  You should receive a call or letter about your lab results within the next week to 10 days, ultrasound today at 1pm - [address], [employer].  Continue Climara and clonidine for hot flushes.  Establish primary care provider in [place] to follow up your medicines in the next 6 months.  Return to the clinic or go to the nearest emergency room if any of your symptoms worsen or new symptoms occur.  Sinusitis Sinusitis is redness, soreness, and swelling (inflammation) of the paranasal sinuses. Paranasal sinuses are air pockets within the bones of your face (beneath the eyes, the middle of the forehead, or above the eyes). In healthy paranasal sinuses, mucus is able to drain out, and air is able to circulate through them by way of your nose. However, when your paranasal sinuses are inflamed, mucus and air can become trapped. This can allow bacteria and other germs to grow and cause infection. Sinusitis can develop quickly and last only a short time (acute) or continue over a long period (chronic). Sinusitis that lasts for more than 12 weeks is considered chronic.  CAUSES  Causes of sinusitis include:  Allergies.  Structural abnormalities, such as displacement of the cartilage that separates your nostrils (deviated septum), which can decrease the air flow through your nose and sinuses and affect sinus drainage.  Functional abnormalities, such as when the small hairs (cilia) that line your sinuses and help remove mucus do not work properly or are not present. SYMPTOMS  Symptoms of acute and chronic sinusitis are the same. The primary symptoms are pain and pressure around the affected sinuses. Other symptoms include:  Upper toothache.  Earache.  Headache.  Bad breath.  Decreased sense of smell and  taste.  A cough, which worsens when you are lying flat.  Fatigue.  Fever.  Thick drainage from your nose, which often is green and may contain pus (purulent).  Swelling and warmth over the affected sinuses. DIAGNOSIS  Your caregiver will perform a physical exam. During the exam, your caregiver may:  Look in your nose for signs of abnormal growths in your nostrils (nasal polyps).  Tap over the affected sinus to check for signs of infection.  View the inside of your sinuses (endoscopy) with a special imaging device with a light attached (endoscope), which is inserted into your sinuses. If your caregiver suspects that you have chronic sinusitis, one or more of the following tests may be recommended:  Allergy tests.  Nasal culture A sample of mucus is taken from your nose and sent to a lab and screened for bacteria.  Nasal cytology A sample of mucus is taken from your nose and examined by your caregiver to determine if your sinusitis is related to an allergy. TREATMENT  Most cases of acute sinusitis are related to a viral infection and will resolve on their own within 10 days. Sometimes medicines are prescribed to help relieve symptoms (pain medicine, decongestants, nasal steroid sprays, or saline sprays).  However, for sinusitis related to a bacterial infection, your caregiver will prescribe antibiotic medicines. These are medicines that will help kill the bacteria causing the infection.  Rarely, sinusitis is caused by a fungal infection. In theses cases, your caregiver will prescribe antifungal medicine. For some cases of chronic sinusitis, surgery is needed. Generally, these are cases in  which sinusitis recurs more than 3 times per year, despite other treatments. HOME CARE INSTRUCTIONS   Drink plenty of water. Water helps thin the mucus so your sinuses can drain more easily.  Use a humidifier.  Inhale steam 3 to 4 times a day (for example, sit in the bathroom with the shower  running).  Apply a warm, moist washcloth to your face 3 to 4 times a day, or as directed by your caregiver.  Use saline nasal sprays to help moisten and clean your sinuses.  Take over-the-counter or prescription medicines for pain, discomfort, or fever only as directed by your caregiver. SEEK IMMEDIATE MEDICAL CARE IF:  You have increasing pain or severe headaches.  You have nausea, vomiting, or drowsiness.  You have swelling around your face.  You have vision problems.  You have a stiff neck.  You have difficulty breathing. MAKE SURE YOU:   Understand these instructions.  Will watch your condition.  Will get help right away if you are not doing well or get worse. Document Released: 09/11/2005 Document Revised: 12/04/2011 Document Reviewed: 09/26/2011 [facility] Patient Information 2014 [place], [place].

## 2013-05-26 ENCOUNTER — Other Ambulatory Visit: Payer: Self-pay | Admitting: Family Medicine

## 2013-05-26 DIAGNOSIS — E042 Nontoxic multinodular goiter: Secondary | ICD-10-CM

## 2013-07-15 ENCOUNTER — Ambulatory Visit (INDEPENDENT_AMBULATORY_CARE_PROVIDER_SITE_OTHER): Payer: BC Managed Care – PPO | Admitting: Surgery

## 2013-07-23 ENCOUNTER — Ambulatory Visit (INDEPENDENT_AMBULATORY_CARE_PROVIDER_SITE_OTHER): Payer: BC Managed Care – PPO | Admitting: Surgery

## 2013-08-13 ENCOUNTER — Encounter (INDEPENDENT_AMBULATORY_CARE_PROVIDER_SITE_OTHER): Payer: Self-pay | Admitting: Surgery

## 2013-08-13 ENCOUNTER — Ambulatory Visit (INDEPENDENT_AMBULATORY_CARE_PROVIDER_SITE_OTHER): Payer: BC Managed Care – PPO | Admitting: Surgery

## 2013-08-13 VITALS — BP 112/72 | HR 76 | Temp 98.2°F | Resp 14 | Ht 69.0 in | Wt 198.8 lb

## 2013-08-13 DIAGNOSIS — E042 Nontoxic multinodular goiter: Secondary | ICD-10-CM

## 2013-08-13 NOTE — Patient Instructions (Signed)

## 2013-08-13 NOTE — Progress Notes (Signed)
General [facility]  Chief Complaint  Patient presents with  . New Evaluation    eval thryoid nodules - referral from Dr. [name]    HISTORY: Patient is a 62 year old female referred by her primary care physician for evaluation of newly diagnosed thyroid nodules. Patient had presented for evaluation with multiple complaints. During her physical exam she was noted to have a nodule in the anterior neck. In August 2014 the patient underwent a thyroid ultrasound. This showed a small thyroid goiter with the right lobe measuring 6.4 cm and left lobe measuring 6.1 cm. The entire gland was heterogeneous. There were multiple small cystic and solid nodules on the right with the largest being a cyst measuring 11 mm. On the left lobe there was a solid nodule measuring 1.6 cm and a complex nodule measuring 1.9 cm. Biopsy was recommended.  Patient has no prior history of thyroid disease. She has had no prior head or neck surgery. She has never been on thyroid medication. TSH level was normal at 0.768.  There is a family history of multinodular goiter in the patient's maternal grandmother. There is no history of thyroid cancer. There is no family history of other endocrine neoplasm.  Past Medical History  Diagnosis Date  . Hypertension   . Hyperlipidemia   . Glucose intolerance (impaired glucose tolerance)   . Osteoporosis     Current Outpatient Prescriptions  Medication Sig Dispense Refill  . albuterol (PROVENTIL HFA;VENTOLIN HFA) 108 (90 BASE) MCG/ACT inhaler Inhale 2 puffs into the lungs every 6 (six) hours as needed for wheezing.  1 Inhaler  0  . cloNIDine (CATAPRES) 0.1 MG tablet Take 1 tablet (0.1 mg total) by mouth 2 (two) times daily.  60 tablet  3  . estradiol (CLIMARA - DOSED IN MG/24 HR) 0.025 mg/24hr patch Place 1 patch (0.025 mg total) onto the skin once a week.  4 patch  6  . hydrochlorothiazide (MICROZIDE) 12.5 MG capsule Take 1 capsule (12.5 mg total)  by mouth every morning.  90 capsule  3   No current facility-administered medications for this visit.    No Known Allergies  Family History  Problem Relation Age of Onset  . Heart disease Mother   . Dementia Mother   . Stroke Mother   . Asthma Sister   . Hypertension Sister     History   Social History  . Marital Status: Married    Spouse Name: N/A    Number of Children: N/A  . Years of Education: N/A   Social History Main Topics  . Smoking status: Never Smoker   . Smokeless tobacco: None  . Alcohol Use: No  . Drug Use: No  . Sexual Activity: Yes    Birth Control/ Protection: Post-menopausal   Other Topics Concern  . None   Social History Narrative   Marital: married x 19 years.      Children:  2 sons; 7 grandchildren      Lives:  With husband.      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      Tobacco: none      Alcohol: none       Exercise:  Walking.           REVIEW OF SYSTEMS - PERTINENT POSITIVES ONLY: Denies compressive symptoms. Denies tremor. Denies palpitations. Occasional dysphagia.  EXAMCeasar Mons Vitals:   08/13/13 1137  BP: 112/72  Pulse: 76  Temp: 98.2 F (36.8 C)  Resp:  14    HEENT: normocephalic; pupils equal and reactive; sclerae clear; dentition good; mucous membranes moist NECK:  Palpable firm discrete 1.5 cm nodule medial left lobe, mobile with swallowing, nontender; right lobe without palpable abnormality; asymmetric on extension; no palpable anterior or posterior cervical lymphadenopathy; no supraclavicular masses; no tenderness CHEST: clear to auscultation bilaterally without rales, rhonchi, or wheezes CARDIAC: regular rate and rhythm without significant murmur; peripheral pulses are full EXT:  non-tender without edema; no deformity NEURO: no gross focal deficits; no sign of tremor   LABORATORY RESULTS: See [facility] ([facility]Epic) for most recent results  RADIOLOGY RESULTS: See [facility] ([facility]L-Epic) for most  recent results  IMPRESSION: Multinodular goiter with dominant left thyroid nodules, 1.6 cm and 1.9 cm  PLAN: I discussed all the above findings at length with the patient. We reviewed her laboratory studies and her thyroid ultrasound results. I have recommended that she proceed with ultrasound-guided fine-needle aspiration biopsy of the 2 dominant nodules in the left thyroid lobe. We will make arrangements for this study in the near future.  If there is any evidence of cytologic atypia or malignancy, then I would recommend total thyroidectomy. If the fine-needle aspiration cytology is benign, then I would recommend continued close observation with repeat ultrasound and physical examination in 6 months.  Patient is provided with written literature to review. We will make arrangements for her fine-needle aspiration biopsy in the near future. We will contact her with the results.  Velora Heckler, MD, FACS General & Endocrine Surgery [facility]  Primary Care Physician: Alfred Levins, MD

## 2013-08-14 ENCOUNTER — Ambulatory Visit
Admission: RE | Admit: 2013-08-14 | Discharge: 2013-08-14 | Disposition: A | Discharge status: Home / Self Care | Payer: BC Managed Care – PPO | Source: Ambulatory Visit | Attending: Surgery | Admitting: Surgery

## 2013-08-14 ENCOUNTER — Other Ambulatory Visit (HOSPITAL_COMMUNITY)
Admission: RE | Admit: 2013-08-14 | Discharge: 2013-08-14 | Disposition: A | Discharge status: Home / Self Care | Payer: BC Managed Care – PPO | Source: Ambulatory Visit | Attending: Interventional Radiology | Admitting: Interventional Radiology

## 2013-08-14 DIAGNOSIS — E041 Nontoxic single thyroid nodule: Secondary | ICD-10-CM | POA: Insufficient documentation

## 2013-08-14 DIAGNOSIS — E042 Nontoxic multinodular goiter: Secondary | ICD-10-CM

## 2013-08-14 IMAGING — US US THYROID BIOPSY
1 series · 13 of 23 positions shown · non-contrast
Comparison: none

CLINICAL DATA: Multiple thyroid nodules, largest on the left.

EXAM:
ULTRASOUND-GUIDED THYROID ASPIRATION BIOPSY x2
TECHNIQUE: The procedure, risks (including but not limited to bleeding,
infection, organ damage ), benefits, and alternatives were explained
to the patient. Questions regarding the procedure were encouraged
and answered. The patient understands and consents to the procedure.

[Series 1: us thyroid biopsy · 0.07mm/px · 23 acquisitions, 13 frames shown]
[im 1/23]
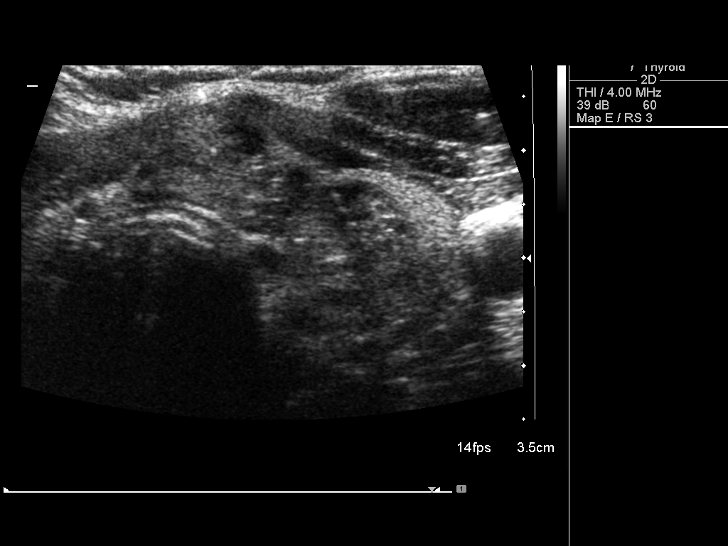
[im 3/23]
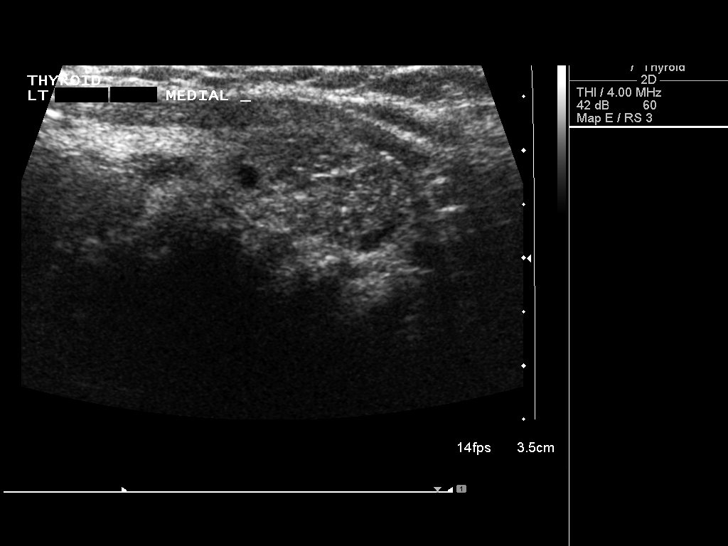
[im 5/23]
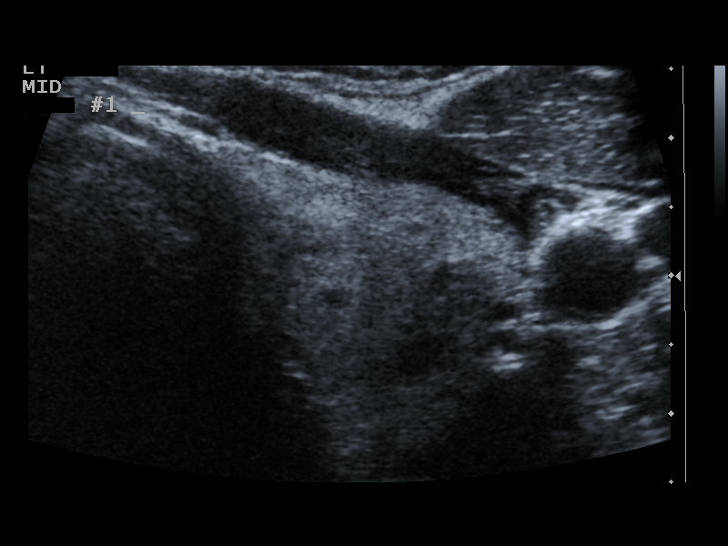
[im 7/23]
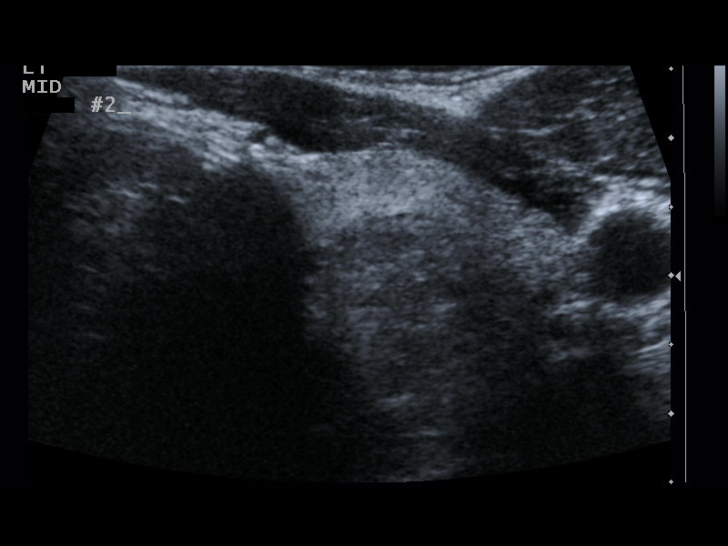
[im 8/23]
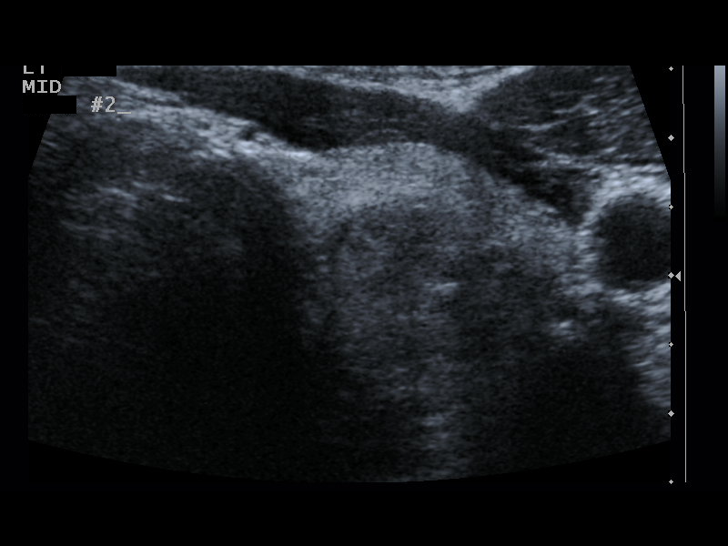
[im 10/23]
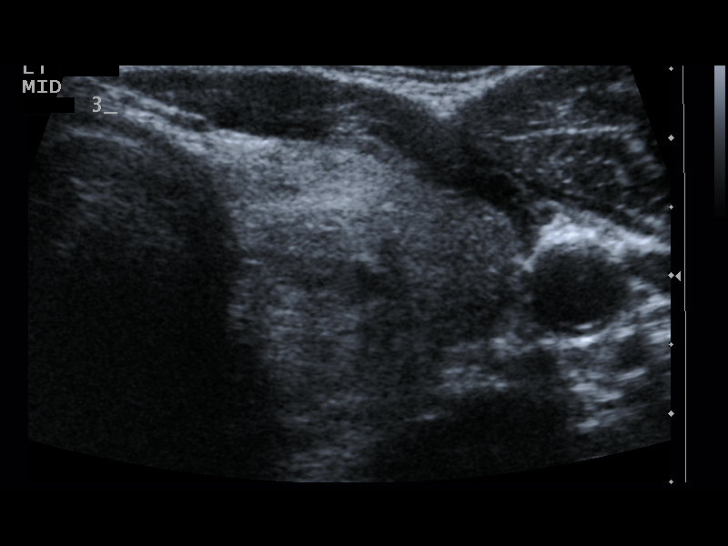
[im 12/23]
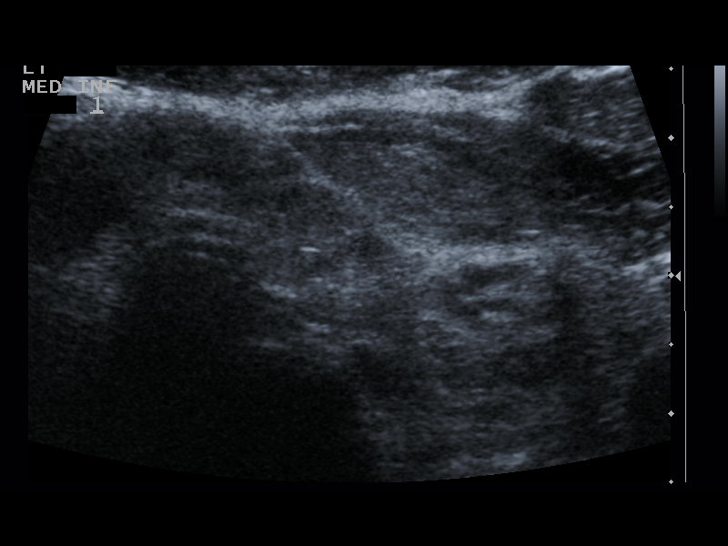
[im 14/23]
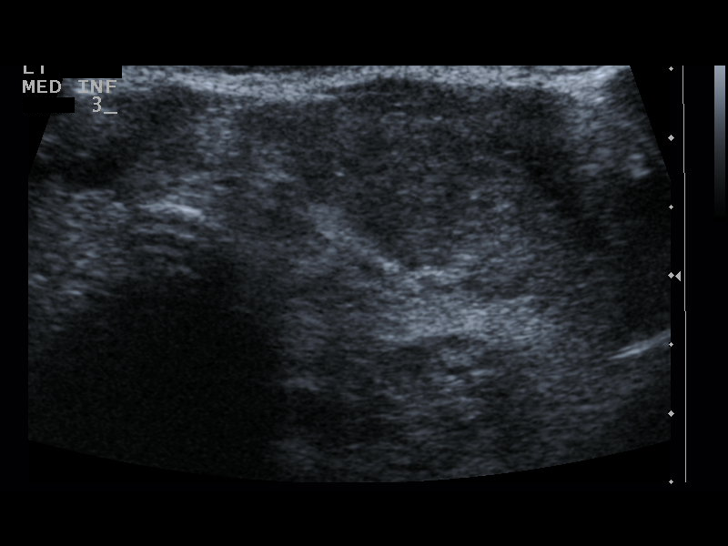
[im 16/23]
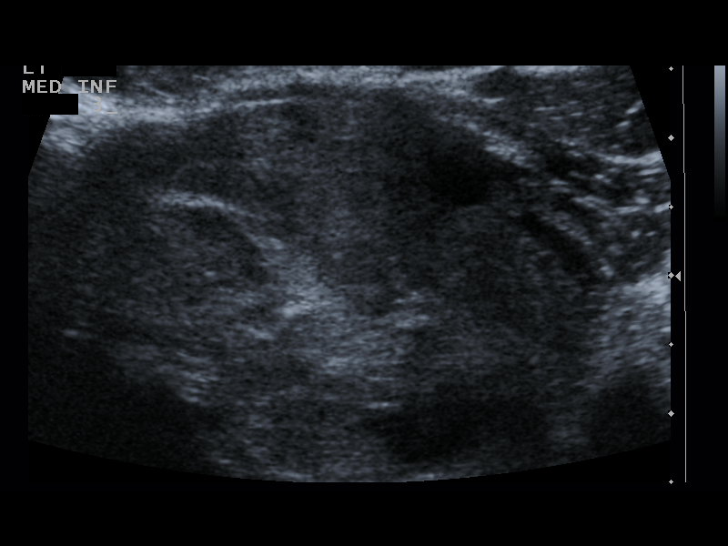
[im 17/23]
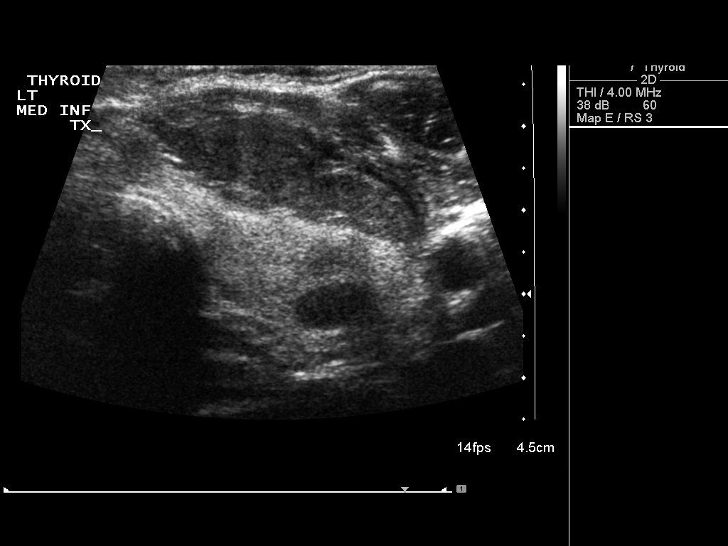
[im 19/23]
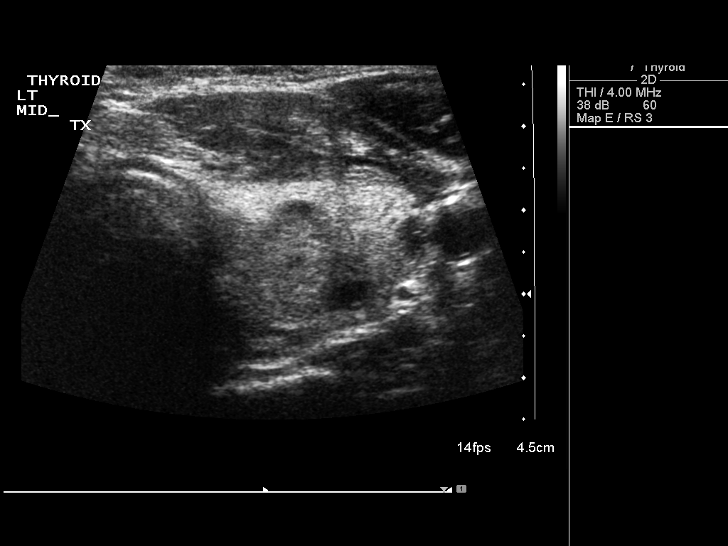
[im 21/23]
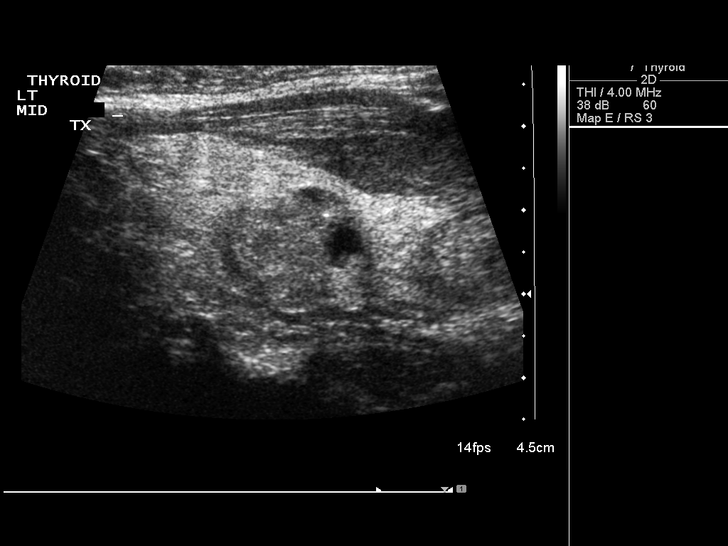
[im 23/23]
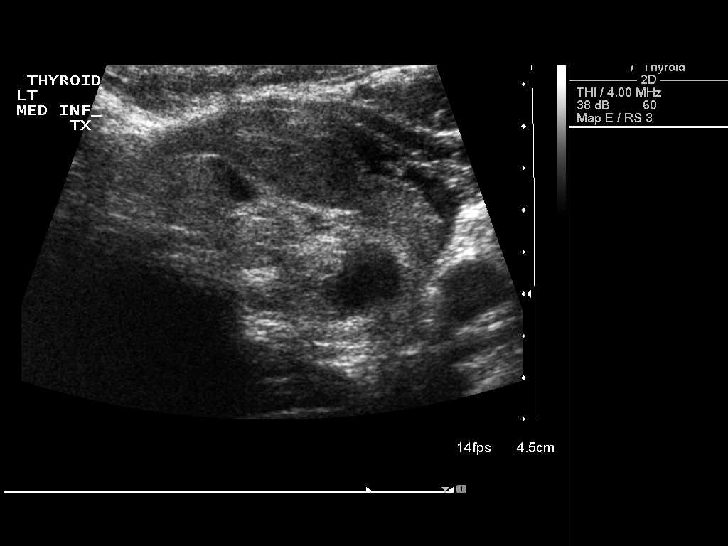

[13 of 23 positions shown; findings below may reference images not displayed]

Survey ultrasound was performed and the dominant lesion in the mid
left lobe was localized. An appropriate skin entry site was
determined. Skin was marked, then prepped with Betadine, draped in
usual sterile fashion, and infiltrated locally with 1% lidocaine.
Under real-time ultrasound guidance, 3 passes were made into the
lesion with 25 gauge needles.

In similar fashion, the largest complex cystic lesion lesion in the
inferior left lobe was localized. An appropriate skin entry site was
determined. Skin was marked, then prepped with Betadine, draped in
usual sterile fashion, and infiltrated locally with 1% lidocaine.
Under real-time ultrasound guidance, 3 passes were made into the
lesion with 25 gauge needles. The patient tolerated procedure well,
with no immediate complications.
IMPRESSION: 1. Technically successful ultrasound-guided thyroid aspiration
biopsy , dominant mid left solid nodule.
2. Technically successful ultrasound-guided FNA of complex cystic
lesion in the left lower pole.

## 2013-08-20 ENCOUNTER — Other Ambulatory Visit: Payer: BC Managed Care – PPO

## 2013-08-28 ENCOUNTER — Other Ambulatory Visit (INDEPENDENT_AMBULATORY_CARE_PROVIDER_SITE_OTHER): Payer: Self-pay

## 2013-08-28 ENCOUNTER — Telehealth (INDEPENDENT_AMBULATORY_CARE_PROVIDER_SITE_OTHER): Payer: Self-pay | Admitting: Surgery

## 2013-08-28 DIAGNOSIS — E042 Nontoxic multinodular goiter: Secondary | ICD-10-CM

## 2013-08-28 NOTE — Telephone Encounter (Signed)
Order for thyroid u/s in May 2015 to [place]. She will schedule and call pt.

## 2013-08-28 NOTE — Telephone Encounter (Signed)
Telephone call to patient with FNA results.  No malignancy.  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  Will see in office in 6 months for exam after repeat ultrasound.  Cindy - please schedule USN of thyroid in 6 months.  tmg  Velora Heckler, MD, [facility] Office: [phone]

## 2013-08-29 NOTE — Telephone Encounter (Signed)
Pt aware of appt.

## 2013-08-29 NOTE — Telephone Encounter (Signed)
LMOM for pt to return my call.  I was calling to notify her of her appt for thyroid US scheduled on 01/30/14 with an arrival time of 12:45pm at [id number].

## 2013-09-08 ENCOUNTER — Other Ambulatory Visit: Payer: Self-pay | Admitting: Family Medicine

## 2013-10-24 ENCOUNTER — Other Ambulatory Visit: Payer: Self-pay | Admitting: Family Medicine

## 2013-10-31 ENCOUNTER — Other Ambulatory Visit: Payer: Self-pay | Admitting: Family Medicine

## 2014-01-30 ENCOUNTER — Other Ambulatory Visit: Payer: BC Managed Care – PPO

## 2014-03-02 ENCOUNTER — Encounter (INDEPENDENT_AMBULATORY_CARE_PROVIDER_SITE_OTHER): Payer: Self-pay | Admitting: Surgery

## 2014-03-13 ENCOUNTER — Other Ambulatory Visit: Payer: Self-pay | Admitting: Family Medicine

## 2014-03-17 ENCOUNTER — Other Ambulatory Visit: Payer: Self-pay | Admitting: Family Medicine

## 2014-03-19 ENCOUNTER — Other Ambulatory Visit: Payer: Self-pay

## 2014-03-19 MED ORDER — HYDROCHLOROTHIAZIDE 12.5 MG PO CAPS: ORAL_CAPSULE | ORAL | Status: DC

## 2014-09-19 ENCOUNTER — Ambulatory Visit (INDEPENDENT_AMBULATORY_CARE_PROVIDER_SITE_OTHER): Payer: BC Managed Care – PPO

## 2014-09-19 ENCOUNTER — Ambulatory Visit (INDEPENDENT_AMBULATORY_CARE_PROVIDER_SITE_OTHER): Payer: BC Managed Care – PPO | Admitting: Physician Assistant

## 2014-09-19 VITALS — BP 150/92 | HR 80 | Temp 97.7°F | Resp 16 | Ht 67.0 in | Wt 212.0 lb

## 2014-09-19 DIAGNOSIS — M79641 Pain in right hand: Secondary | ICD-10-CM

## 2014-09-19 DIAGNOSIS — R81 Glycosuria: Secondary | ICD-10-CM

## 2014-09-19 DIAGNOSIS — R1032 Left lower quadrant pain: Secondary | ICD-10-CM

## 2014-09-19 DIAGNOSIS — R3129 Other microscopic hematuria: Secondary | ICD-10-CM

## 2014-09-19 DIAGNOSIS — R312 Other microscopic hematuria: Secondary | ICD-10-CM

## 2014-09-19 DIAGNOSIS — I10 Essential (primary) hypertension: Secondary | ICD-10-CM

## 2014-09-19 LAB — BASIC METABOLIC PANEL
BUN: 14 mg/dL (ref 6–23)
CALCIUM: 9.7 mg/dL (ref 8.4–10.5)
CO2: 26 mEq/L (ref 19–32)
CREATININE: 0.99 mg/dL (ref 0.50–1.10)
Chloride: 108 mEq/L (ref 96–112)
Glucose, Bld: 112 mg/dL — ABNORMAL HIGH (ref 70–99)
Potassium: 4 mEq/L (ref 3.5–5.3)
SODIUM: 141 meq/L (ref 135–145)

## 2014-09-19 LAB — POCT UA - MICROSCOPIC ONLY
CASTS, UR, LPF, POC: NEGATIVE
CRYSTALS, UR, HPF, POC: NEGATIVE
Mucus, UA: NEGATIVE
Yeast, UA: NEGATIVE

## 2014-09-19 LAB — POCT URINALYSIS DIPSTICK
BILIRUBIN UA: NEGATIVE
GLUCOSE UA: 250
Ketones, UA: NEGATIVE
LEUKOCYTES UA: NEGATIVE
Nitrite, UA: NEGATIVE
Protein, UA: NEGATIVE
Spec Grav, UA: 1.025
UROBILINOGEN UA: 0.2
pH, UA: 5

## 2014-09-19 LAB — POCT GLYCOSYLATED HEMOGLOBIN (HGB A1C): Hemoglobin A1C: 6.5

## 2014-09-19 IMAGING — CR DG HAND COMPLETE 3+V*R*
3 series · 3 of 3 positions shown · non-contrast
Comparison: None.

CLINICAL DATA: Acute right hand pain for 2 weeks over the MCP
joints.

EXAM:
RIGHT HAND - COMPLETE 3+ VIEW

[PA]
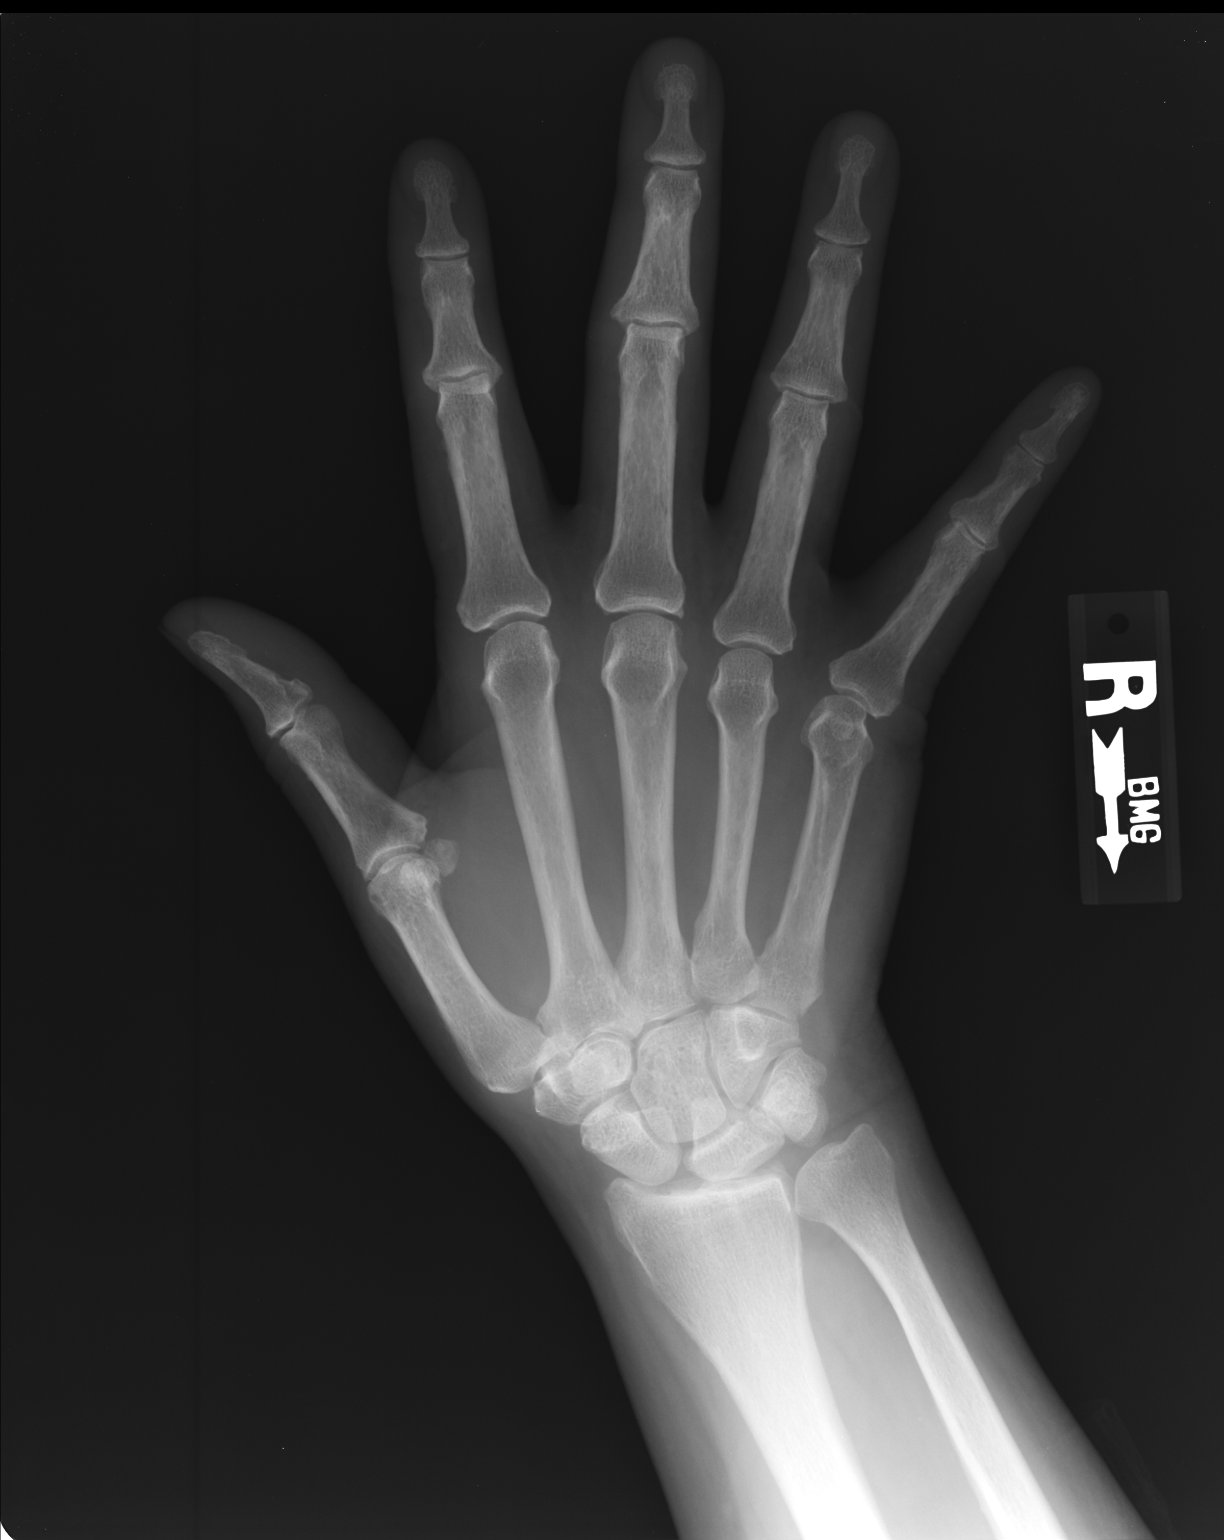

[pa obl]
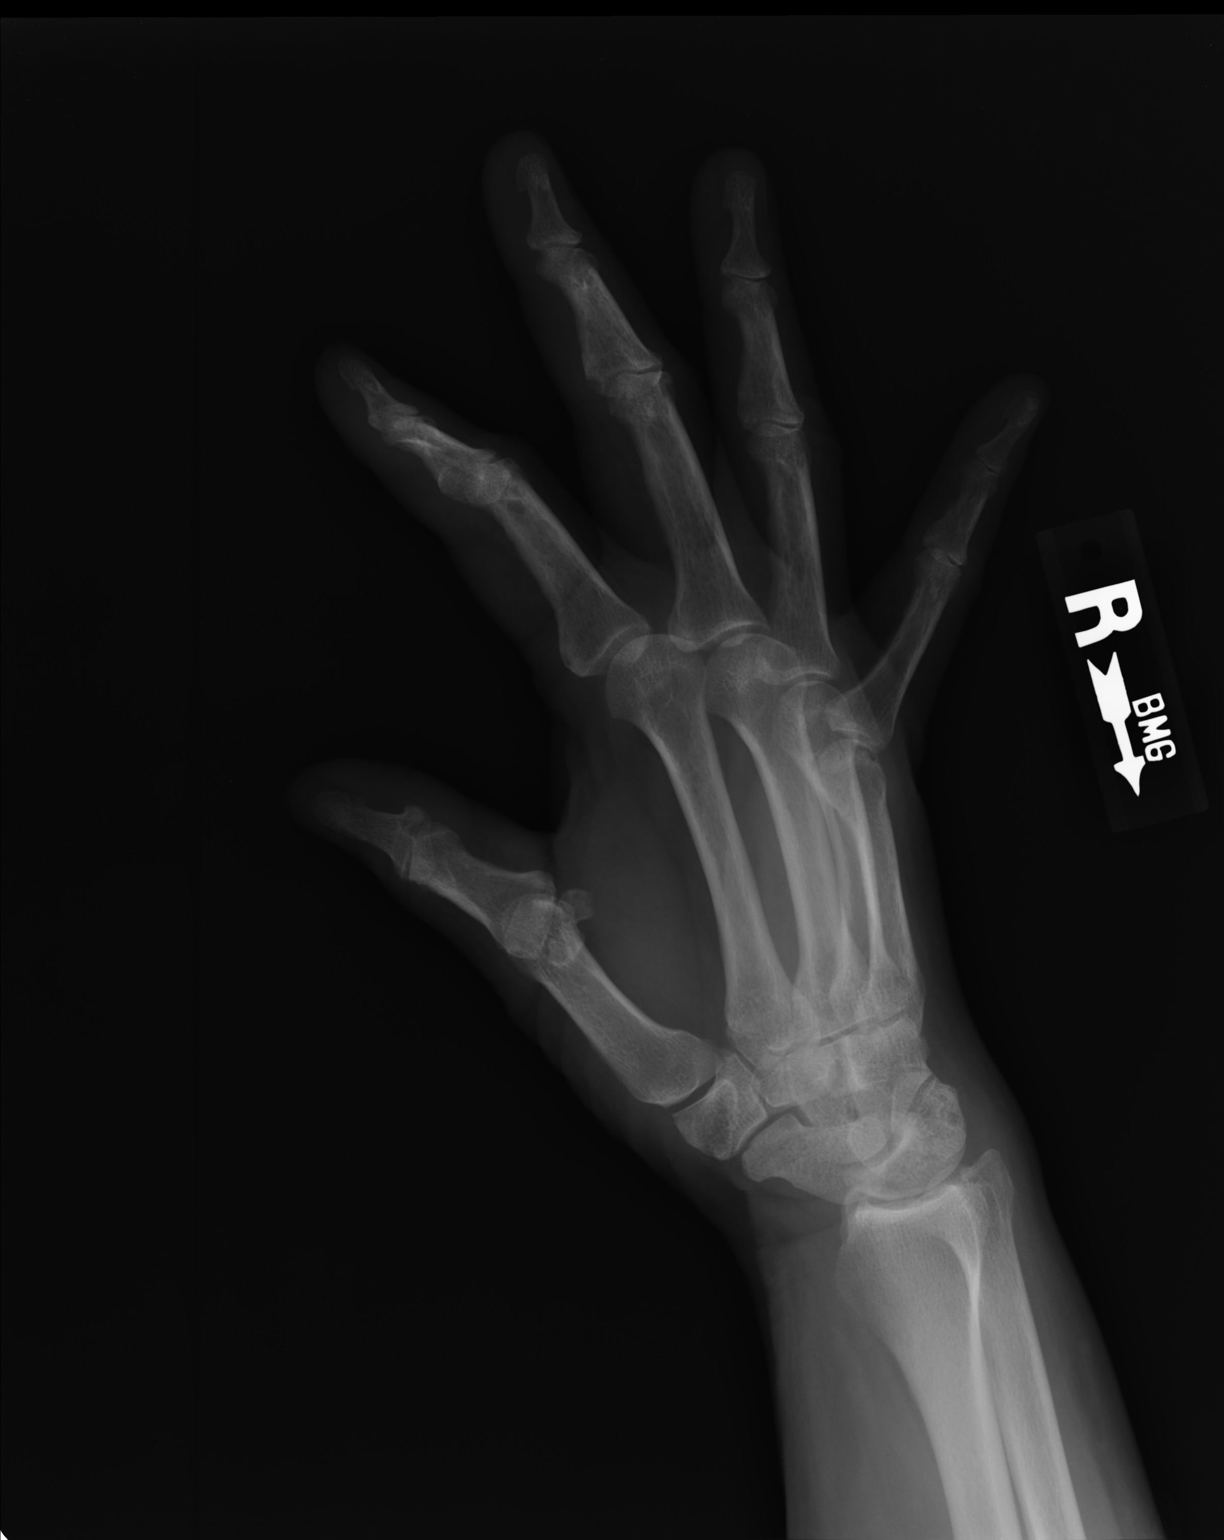

[lateral]
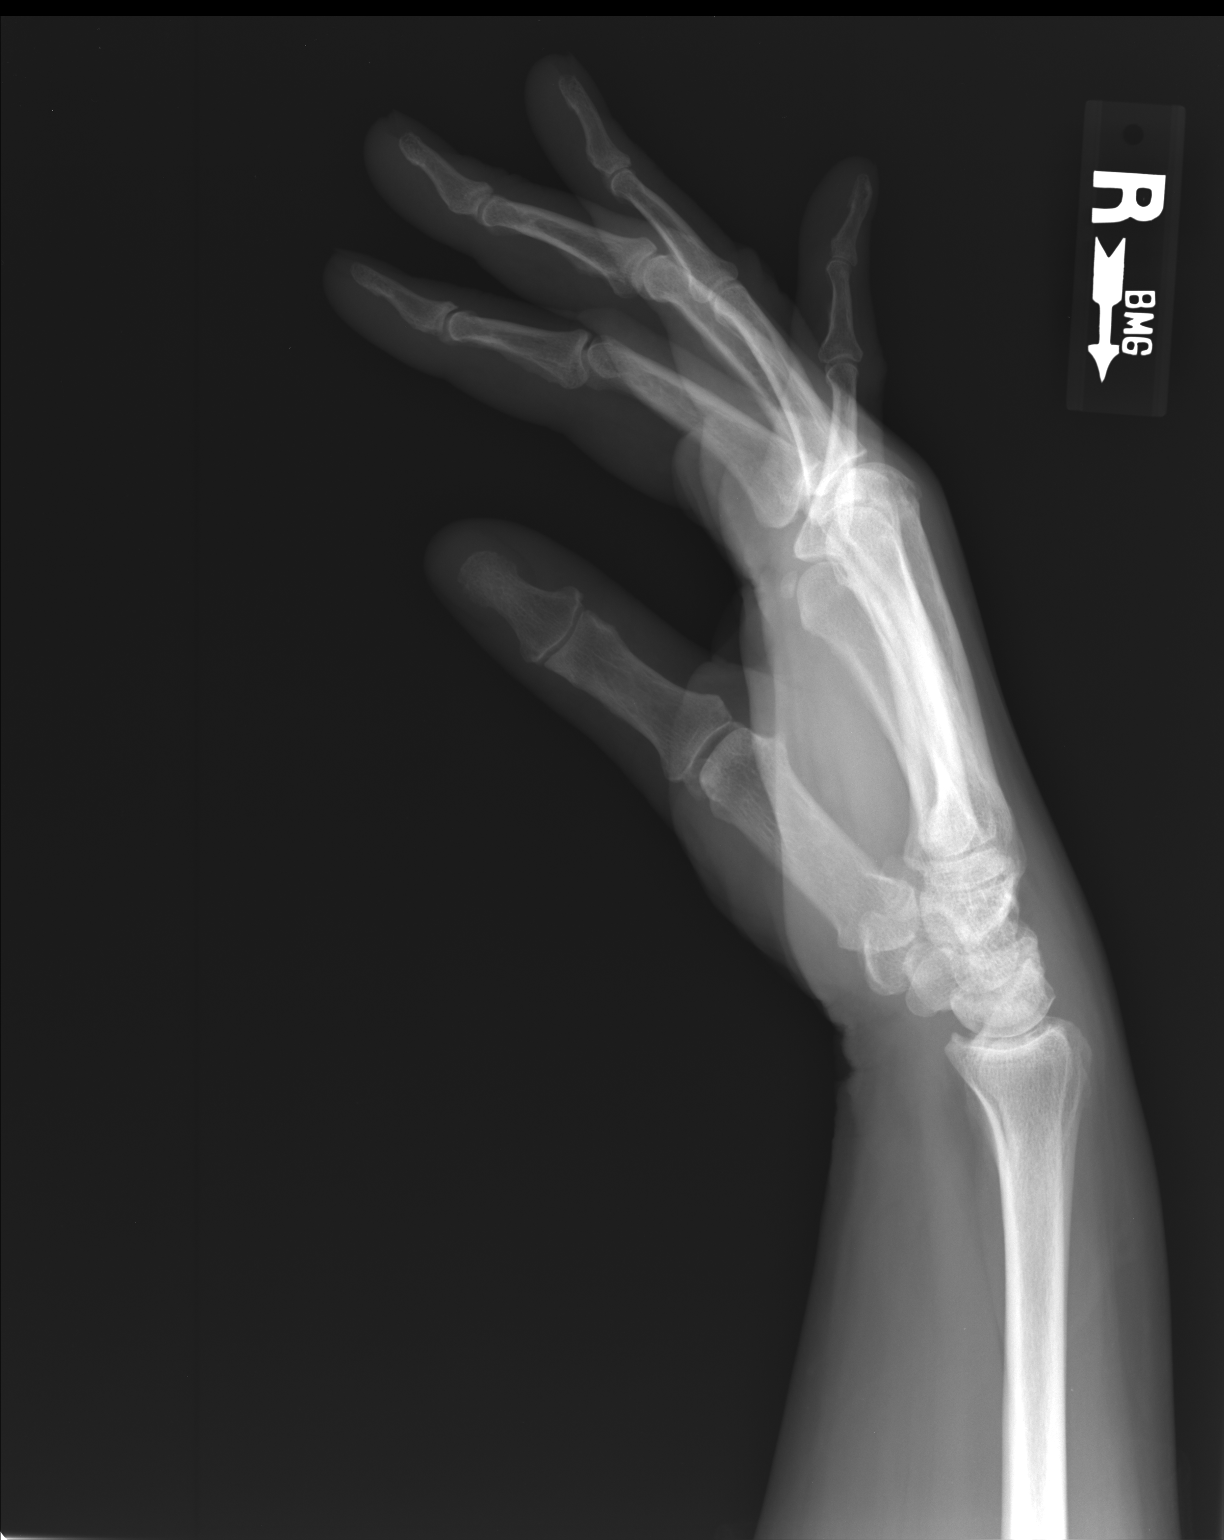

[3 of 3 positions shown; findings below may reference images not displayed]

FINDINGS: Normal alignment without acute fracture. No significant arthropathy.
Preserved joint spaces. No soft tissue abnormality or radiopaque
foreign body.
IMPRESSION: No acute finding.

## 2014-09-19 MED ORDER — HYDROCHLOROTHIAZIDE 12.5 MG PO CAPS: 12.5000 mg | ORAL_CAPSULE | Freq: Every day | ORAL | Status: DC

## 2014-09-19 MED ORDER — MELOXICAM 7.5 MG PO TABS: 7.5000 mg | ORAL_TABLET | Freq: Every day | ORAL | Status: DC

## 2014-09-19 MED ORDER — DOXYCYCLINE HYCLATE 100 MG PO CAPS: 100.0000 mg | ORAL_CAPSULE | Freq: Two times a day (BID) | ORAL | Status: DC

## 2014-09-19 NOTE — Patient Instructions (Signed)
Your hctz was refilled for one year. We checked your kidney function today and will let you know the results of that in one week. You had blood in your urine today (small amount). Please be sure to com back here or to your [place]cp in [place] in 3 months to have another ua to ensure this has resoled.. Your A1C was 6.5% today, which is the low end of diabetes. Please be sure to start exercising again, watching your diet, and return here or to your pcp in 3 months for another A1C to see how its doing and ensure we don't need to start medication.  Your hand xray looked ok today. Please wear the splint every day for the next 3-4 weeks. Be sure to take if off a couple times every day to get full range of motion in your fingers. I have prescribed mobic for you to take once daily for the next 2-3 weeks. If these measures don't help please let us know and we'll refer you to ortho. For your stomach, we feel it is likely a cyst causing you the pain. You can try the doxycycline twice daily for 10 days. If this doesn't help and the spot continues to bother you we can refer you to general surgery to have it removed.

## 2014-09-19 NOTE — Progress Notes (Signed)
Subjective:    Patient ID: Virginia Larson, female    DOB: 05-Feb-1951, 63 y.o.   MRN: [id number]  PCP: Alfred Levins, MD  Chief Complaint  Patient presents with  . Medication Refill    Clonidine, HCTZ  . Abdominal Pain    X several months, LLQ  . Hand Pain    Right hand, X 1 month, says "fingers will lock" and has to use left hand to extend fingers on right hand   Patient Active Problem List   Diagnosis Date Noted  . Multinodular goiter (nontoxic), dominant nodules left lobe 08/13/2013  . Essential hypertension, benign 02/07/2013  . 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 Vitamin d deficiency 04/03/2012  . Hyperlipidemia LDL goal < 100 04/03/2012  . Type II or unspecified type diabetes mellitus without mention of complication, not stated as uncontrolled 04/03/2012  . Obesity (BMI 30.0-34.9) 04/03/2012  . DJD (degenerative joint disease) 04/03/2012   Prior to Admission medications   Medication Sig Start Date End Date Taking? Authorizing Provider  cloNIDine (CATAPRES) 0.1 MG tablet Take 1 tablet (0.1 mg total) by mouth 2 (two) times daily. PATIENT NEEDS OFFICE VISIT FOR ADDITIONAL REFILLS 10/24/13  Yes Heather M Marte, PA-C  hydrochlorothiazide (MICROZIDE) 12.5 MG capsule Take 1 capsule (12.5 mg total) by mouth daily. PATIENT NEEDS OFFICE VISIT FOR ADDITIONAL REFILLS   Yes Godfrey Pick, PA-C  albuterol (PROVENTIL HFA;VENTOLIN HFA) 108 (90 BASE) MCG/ACT inhaler Inhale 2 puffs into the lungs every 6 (six) hours as needed for wheezing. Patient not taking: Reported on 09/19/2014 05/23/13   Shade Flood, MD   Medications, allergies, past medical history, surgical history, family history, social history and problem list reviewed and updated.  HPI  41 yof with pmh htn, diet controlled dm, and goiter nodules presents for med refill, abd pain, and right hand pain.   Medication refill - Has been on hctz 12.5mg  for yrs for htn, generally well controlled on this regimen for  yrs. She was put on clonidine 1 yr ago for hot flashes but states this has not helped at all. She is from [place] but has been living in [place] for the past year. Her husband is a Education officer, environmental and they may be moving again soon (possibly back to [place]). She therefore has a pcp in [place] but prefers to continue to come here for her care for the time being. Does not check her bp at home. Has been well controlled last several appts but elevated today. Pt states she has been taking her meds only every 2-3 days for the past couple months until she could make it back to [place] for this appt. Denies cp, sob, palps, dizziness, syncope, vision changes, ha.   Left lower quadrant pain - Described as a sore spot in her skin. Only painful when she pushes on it, otherwise does not bother her. Denies any skin changes she's noticed. No other abd pain, no N/V, no diarrhea, no constipation or bowel changes. No fever, chills. No dysuria, hematuria, blood in stool. Hx hysterectomy but ovaries still present.   Right hand pain - For past several months feels as if 3rd and 4th fingers of right hand get locked in flexion. She has to manually extend them. Not painful but is bothersome to her.   She also mentions continued hot flashes today. Her new pcp is following this.   Reading back through notes she was also due for a f/u thyroid US 6 months ago. This was not done,  however, as pt states her pcp palpated her thyroid and felt normal so she was told another repeat us is not neccessary at this time.   Review of Systems See HPI. No unintentional wt loss, night sweats, fever, chills.     Objective:   Physical Exam  Constitutional: She is oriented to person, place, and time. She appears well-developed and well-nourished.  Non-toxic appearance. She does not have a sickly appearance. She does not appear ill. No distress.  BP 150/92 mmHg  Pulse 80  Temp(Src) 97.7 F (36.5 C) (Oral)  Resp 16  Ht 5\' 7"  (1.702 m)  Wt 212 lb  (96.163 kg)  BMI 33.20 kg/m2  SpO2 99%   Eyes: Conjunctivae and EOM are normal. Pupils are equal, round, and reactive to light.  Cardiovascular: Regular rhythm and normal heart sounds.  Exam reveals no gallop.   No murmur heard. Abdominal: Soft. Normal appearance and bowel sounds are normal. She exhibits mass. There is tenderness in the left lower quadrant. There is no rigidity, no rebound, no guarding, no CVA tenderness, no tenderness at McBurney's point and negative Murphy's sign.    Small 1cm cyst llq 1" over inguinal ligament. No overlying skin changes. TTP over area. Area observed with Porfirio Oarhelle Jeffery, PA-C  Musculoskeletal:       Right hand: Normal. She exhibits normal range of motion, no tenderness and no bony tenderness. Normal sensation noted. Normal strength noted.  Neurological: She is alert and oriented to person, place, and time.  Psychiatric: She has a normal mood and affect. Her speech is normal.    Results for orders placed or performed in visit on 09/19/14  POCT UA - Microscopic Only  Result Value Ref Range   WBC, Ur, HPF, POC 0-1    RBC, urine, microscopic 1-3    Bacteria, U Microscopic trace    Mucus, UA neg    Epithelial cells, urine per micros 0-2    Crystals, Ur, HPF, POC neg    Casts, Ur, LPF, POC neg    Yeast, UA neg   POCT urinalysis dipstick  Result Value Ref Range   Color, UA yellow    Clarity, UA clear    Glucose, UA 250    Bilirubin, UA neg    Ketones, UA neg    Spec Grav, UA 1.025    Blood, UA trace    pH, UA 5.0    Protein, UA neg    Urobilinogen, UA 0.2    Nitrite, UA neg    Leukocytes, UA Negative   POCT glycosylated hemoglobin (Hb A1C)  Result Value Ref Range   Hemoglobin A1C 6.5    [facility] reading (PRIMARY) by  Dr. Katrinka BlazingSmith. Findings: Normal. No acute findings.      Assessment & Plan:   [age] yof with pmh htn, diet controlled dm, and goiter nodules presents for med refill, abd pain, and right hand pain.   Essential hypertension - Plan:  Basic metabolic panel, POCT UA - Microscopic Only, POCT urinalysis dipstick, hydrochlorothiazide (MICROZIDE) 12.5 MG capsule --elevated today but pt has not been taking hctz regularly last couple months --hctz refilled for one year, bmp today --dc clonidine as was prescribed for hot flashes and pt did not feel it was working.  Abdominal pain, left lower quadrant - Plan: POCT UA - Microscopic Only, POCT urinalysis dipstick, doxycycline (VIBRAMYCIN) 100 MG capsule --small mass over abdomen --likely epidermal cyst --too deep for excision in clinic today --no overlying skin changes --doxy today --refer to general  surgery if not resolving and bothersome  Right hand pain - Plan: DG Hand Complete Right, meloxicam (MOBIC) 7.5 MG tablet --xr normal --likely trigger finger --splint in slight flexion for 3 weeks --meloxicam --ortho referral if continues for possible steroid injx  Microscopic hematuria --pt instructed to rtc 3 months for repeat ua  Glucosuria - Plan: POCT glycosylated hemoglobin (Hb A1C) --a1c 6.5 today --pt encouraged to start exercising again and watch sugar intake --rtc 3 months for repeat and possible medication at that time  Donnajean Lopesodd M. Sajid Ruppert, PA-C Physician Assistant-Certified [facility]  09/19/2014 1:35 PM

## 2015-09-16 ENCOUNTER — Ambulatory Visit (INDEPENDENT_AMBULATORY_CARE_PROVIDER_SITE_OTHER): Payer: Medicare Other

## 2015-09-16 ENCOUNTER — Ambulatory Visit (INDEPENDENT_AMBULATORY_CARE_PROVIDER_SITE_OTHER): Payer: Medicare Other | Admitting: Family Medicine

## 2015-09-16 VITALS — BP 138/88 | HR 105 | Temp 97.9°F | Resp 17 | Ht 68.0 in | Wt 207.0 lb

## 2015-09-16 DIAGNOSIS — I10 Essential (primary) hypertension: Secondary | ICD-10-CM

## 2015-09-16 DIAGNOSIS — J069 Acute upper respiratory infection, unspecified: Secondary | ICD-10-CM

## 2015-09-16 DIAGNOSIS — N951 Menopausal and female climacteric states: Secondary | ICD-10-CM

## 2015-09-16 DIAGNOSIS — M79671 Pain in right foot: Secondary | ICD-10-CM

## 2015-09-16 DIAGNOSIS — M79641 Pain in right hand: Secondary | ICD-10-CM

## 2015-09-16 DIAGNOSIS — R232 Flushing: Secondary | ICD-10-CM

## 2015-09-16 DIAGNOSIS — R7302 Impaired glucose tolerance (oral): Secondary | ICD-10-CM | POA: Diagnosis not present

## 2015-09-16 DIAGNOSIS — E1165 Type 2 diabetes mellitus with hyperglycemia: Secondary | ICD-10-CM | POA: Diagnosis not present

## 2015-09-16 LAB — COMPLETE METABOLIC PANEL WITH GFR
ALBUMIN: 4 g/dL (ref 3.6–5.1)
ALK PHOS: 102 U/L (ref 33–130)
ALT: 13 U/L (ref 6–29)
AST: 14 U/L (ref 10–35)
BILIRUBIN TOTAL: 0.6 mg/dL (ref 0.2–1.2)
BUN: 12 mg/dL (ref 7–25)
CO2: 28 mmol/L (ref 20–31)
CREATININE: 1.06 mg/dL — AB (ref 0.50–0.99)
Calcium: 9.7 mg/dL (ref 8.6–10.4)
Chloride: 105 mmol/L (ref 98–110)
GFR, Est African American: 64 mL/min (ref >=60)
GFR, Est Non African American: 55 mL/min — ABNORMAL LOW (ref >=60)
GLUCOSE: 116 mg/dL — AB (ref 65–99)
Potassium: 4.2 mmol/L (ref 3.5–5.3)
SODIUM: 142 mmol/L (ref 135–146)
TOTAL PROTEIN: 7 g/dL (ref 6.1–8.1)

## 2015-09-16 LAB — POCT GLYCOSYLATED HEMOGLOBIN (HGB A1C): Hemoglobin A1C: 7.2

## 2015-09-16 IMAGING — CR DG FOOT COMPLETE 3+V*R*
3 series · 3 of 3 positions shown · non-contrast
Comparison: None.

CLINICAL DATA: Right lateral foot pain for 6 months. No known
injury.

EXAM:
RIGHT FOOT COMPLETE - 3+ VIEW

[AP]
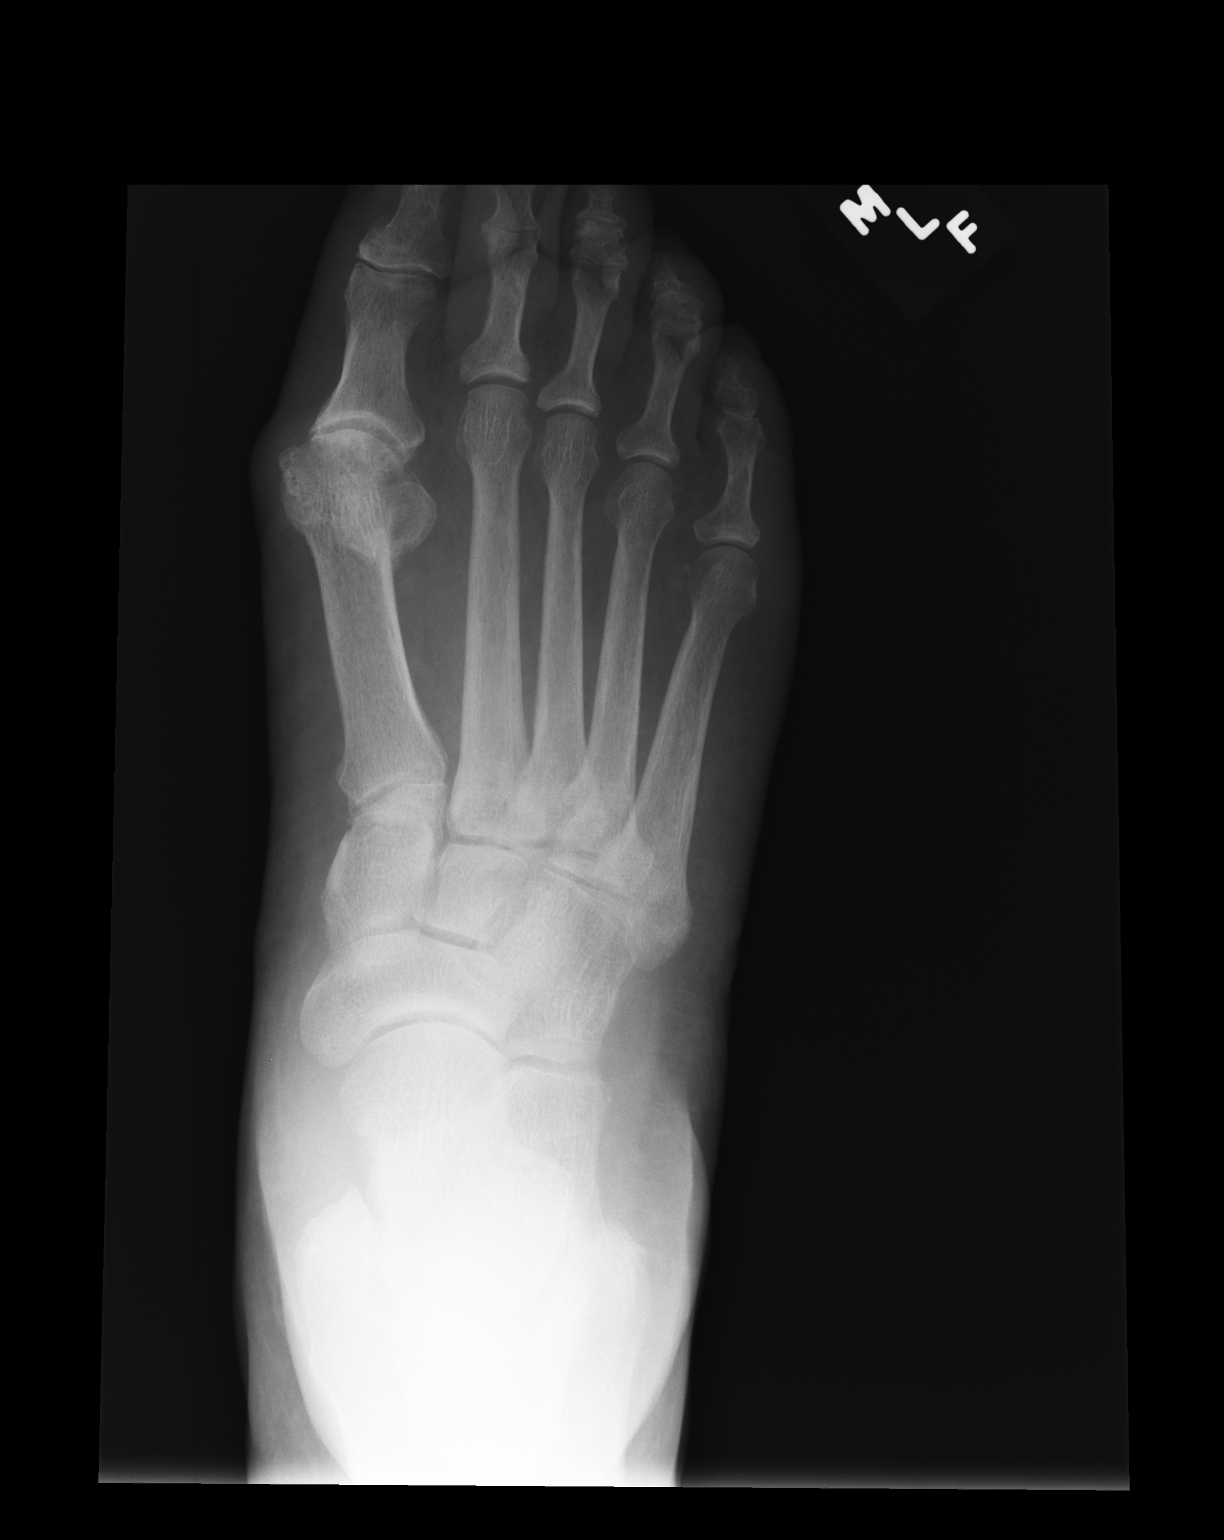

[ap obl int rot]
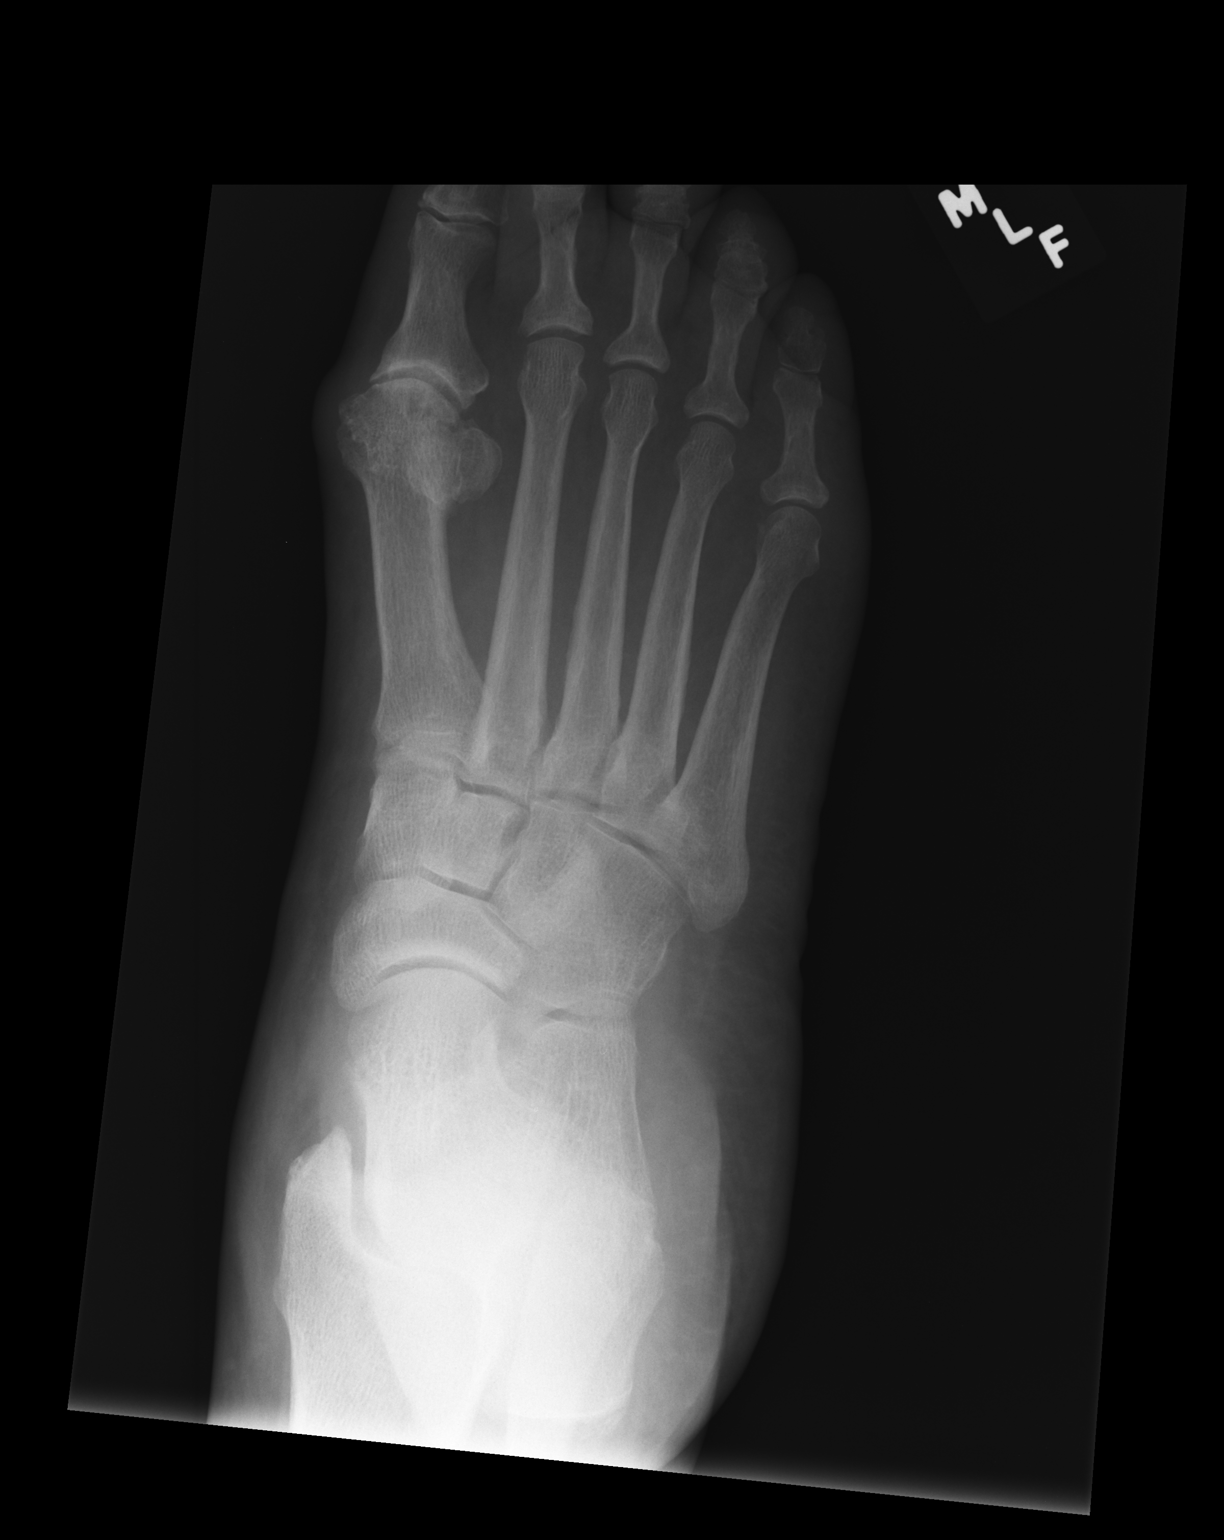

[lateral]
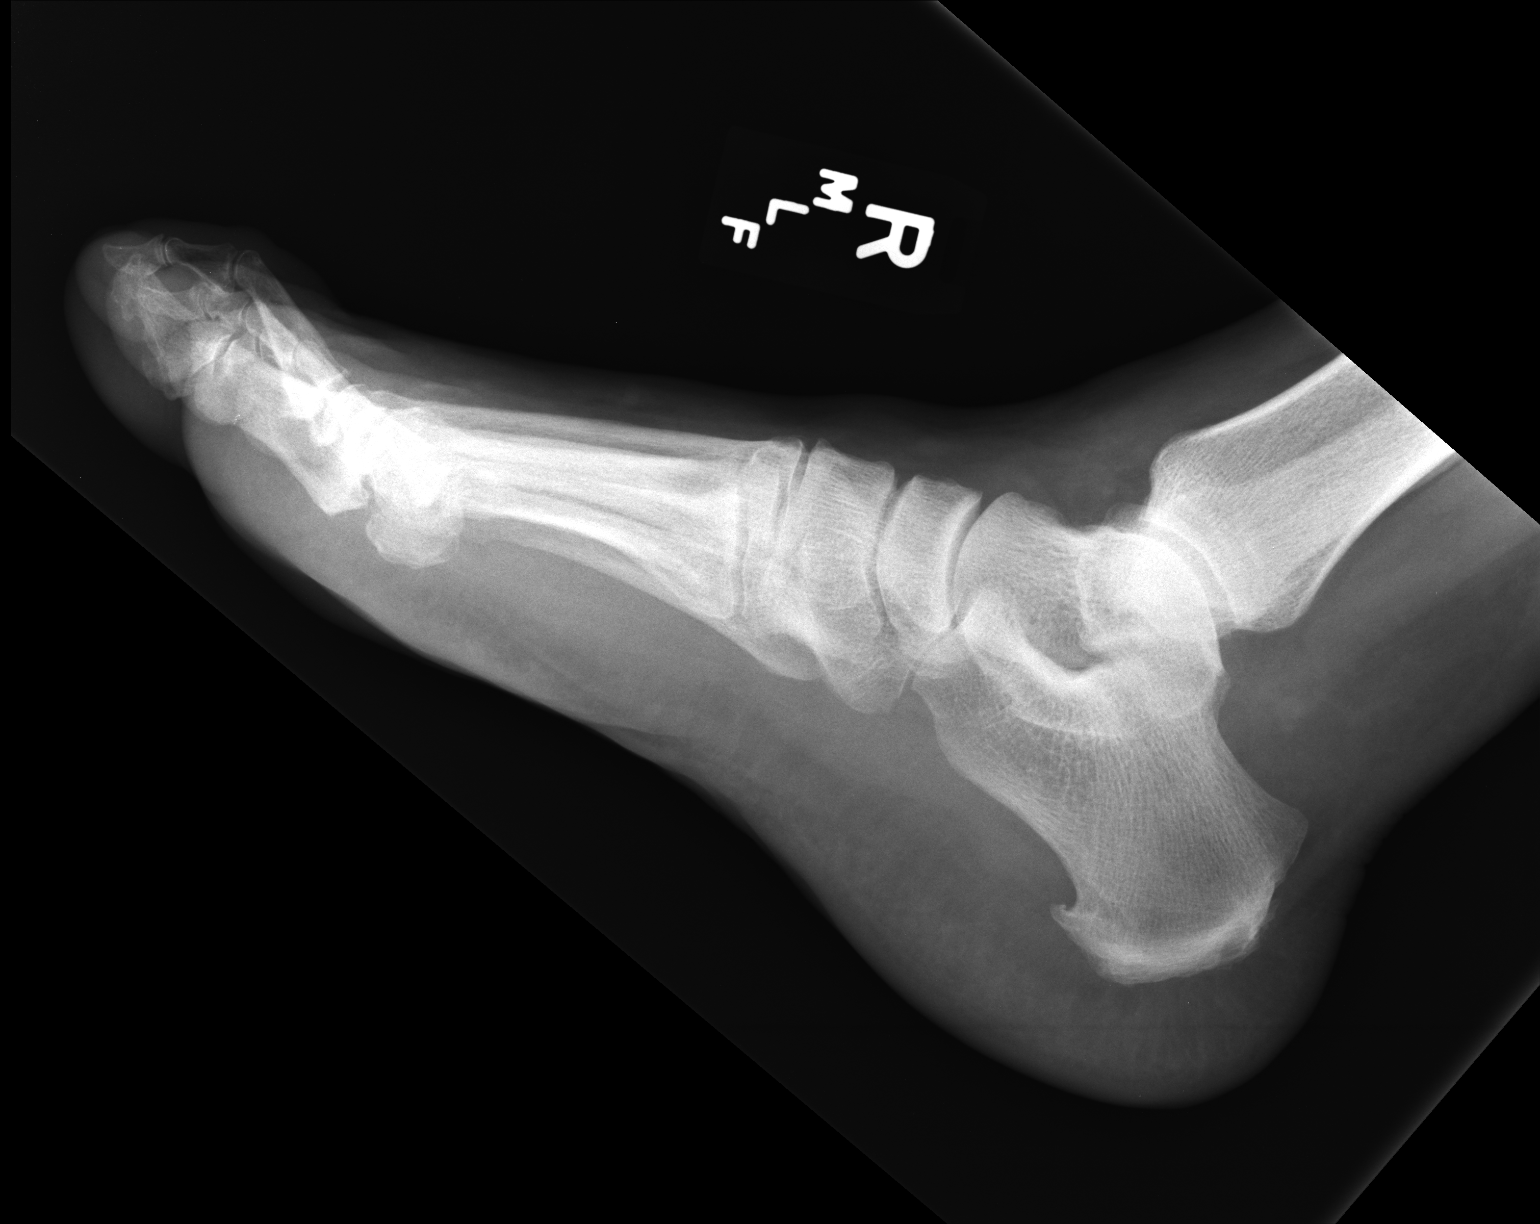

[3 of 3 positions shown; findings below may reference images not displayed]

FINDINGS: There are moderately advanced degenerative changes at the first
metatarsal phalangeal joint with an associated hallux valgus
deformity probable bunion. The mineralization and alignment are
otherwise normal. There is no evidence of acute fracture or
dislocation. Small plantar calcaneal spur noted.
IMPRESSION: No acute findings. Moderate degenerative changes at the first MTP
joint.

## 2015-09-16 MED ORDER — HYDROCHLOROTHIAZIDE 12.5 MG PO CAPS: 12.5000 mg | ORAL_CAPSULE | Freq: Every day | ORAL | Status: DC

## 2015-09-16 MED ORDER — METFORMIN HCL 500 MG PO TABS: 500.0000 mg | ORAL_TABLET | Freq: Two times a day (BID) | ORAL | Status: DC

## 2015-09-16 MED ORDER — GUAIFENESIN ER 1200 MG PO TB12: 1.0000 | ORAL_TABLET | Freq: Two times a day (BID) | ORAL | Status: DC | PRN

## 2015-09-16 MED ORDER — MELOXICAM 7.5 MG PO TABS: 7.5000 mg | ORAL_TABLET | Freq: Every day | ORAL | Status: DC

## 2015-09-16 MED ORDER — VENLAFAXINE HCL 37.5 MG PO TABS: 37.5000 mg | ORAL_TABLET | Freq: Every day | ORAL | Status: DC

## 2015-09-16 NOTE — Progress Notes (Addendum)
[facility] [address], [place] [address] [phone]  Date:  09/16/2015   Name:  Virginia Larson   DOB:  04/03/1951   MRN:  [id number]  PCP:  Alfred LevinsGREENE, JEFFREY DAVID, MD   Chief Complaint  Patient presents with  . Ankle Pain    right side   . Foot Pain    right   . Hot Flashes  . Sinusitis  . Medication Refill    hctz,mobic,     History of Present Illness:  Virginia Larson is a 64 y.o. female patient who [facility] of     Ankle and foot pain.  Main pain is at her medial anke.  6-7 months ago.  She has never had  Tingling in toes and bottom in foot, with antalgic gait.  Swollen.  Swelling at the ankle when she is driving.    Sinus infections[place]3 days [place] nasal, congestion, neck ache. Mainly right side of ear discomfort.  Coughing with yellow mucus secondary post nasal drip--no sob or dyspnea HCTZ--refill.  No chest pains, occasional palpitations within the last year.  She is    She also complains of hot flashes.  Patient is post hysterectomy in her 30s and complains of heavy hot flashes.  She will have to use towels on her bed.  She was placed on the patch for years, but this was discontinued due to age. Patient Active Problem List   Diagnosis Date Noted  . Multinodular goiter (nontoxic), dominant nodules left lobe 08/13/2013  . Essential hypertension, benign 02/07/2013  . 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 Vitamin D deficiency 04/03/2012  . Hyperlipidemia LDL goal < 100 04/03/2012  . Type II or unspecified type diabetes mellitus without mention of complication, not stated as uncontrolled 04/03/2012  . Obesity (BMI 30.0-34.9) 04/03/2012  . DJD (degenerative joint disease) 04/03/2012    Past Medical History  Diagnosis Date  . Hypertension   . Hyperlipidemia   . Glucose intolerance (impaired glucose tolerance)   . Osteoporosis     Past Surgical History  Procedure Laterality Date  .  Abdominal hysterectomy      fibroids.  Ovaries intact.  . Breast surgery      L lump resection; benign.    Social History  Substance Use Topics  . Smoking status: Never Smoker   . Smokeless tobacco: None  . Alcohol Use: No    Family History  Problem Relation Age of Onset  . Heart disease Mother   . Dementia Mother   . Stroke Mother   . Asthma Sister   . Hypertension Sister     No Known Allergies  Medication list has been reviewed and updated.  Current Outpatient Prescriptions on File Prior to Visit  Medication Sig Dispense Refill  . hydrochlorothiazide (MICROZIDE) 12.5 MG capsule Take 1 capsule (12.5 mg total) by mouth daily. 90 capsule 3  . meloxicam (MOBIC) 7.5 MG tablet Take 1 tablet (7.5 mg total) by mouth daily. (Patient not taking: Reported on 09/16/2015) 30 tablet 0   No current facility-administered medications on file prior to visit.    ROS ROS otherwise unremarkable unless listed above.   Physical Examination: BP 138/88 mmHg  Pulse 105  Temp(Src) 97.9 F (36.6 C) (Oral)  Resp 17  Ht 5\' 8"  (1.727 m)  Wt 207 lb (93.895 kg)  BMI 31.48 kg/m2  SpO2 97% Ideal Body Weight: Weight in (lb) to have BMI = 25: 164.1  Physical Exam  Constitutional: She is oriented to person, place, and time. She appears well-developed and well-nourished. No distress.  HENT:  Head: Normocephalic and atraumatic.  Right Ear: Tympanic membrane, external ear and ear canal normal.  Left Ear: Tympanic membrane, external ear and ear canal normal.  Nose: Rhinorrhea present. No mucosal edema. Right sinus exhibits no maxillary sinus tenderness and no frontal sinus tenderness. Left sinus exhibits no maxillary sinus tenderness and no frontal sinus tenderness.  Eyes: Conjunctivae and EOM are normal. Pupils are equal, round, and reactive to light.  Cardiovascular: Normal rate and regular rhythm.  Exam reveals no gallop and no friction rub.   No murmur heard. Pulmonary/Chest: Effort normal. No  respiratory distress. She has no decreased breath sounds. She has no wheezes. She has no rhonchi.  Lymphadenopathy:       Head (right side): No submental, no submandibular and no tonsillar adenopathy present.       Head (left side): No submental, no submandibular and no tonsillar adenopathy present.    She has no cervical adenopathy.  Neurological: She is alert and oriented to person, place, and time.  Skin: She is not diaphoretic.  Psychiatric: She has a normal mood and affect. Her behavior is normal.   Results for orders placed or performed in visit on 09/16/15  POCT glycosylated hemoglobin (Hb A1C)  Result Value Ref Range   Hemoglobin A1C 7.2    [facility] reading (PRIMARY) by  Dr. Milus Glazier: Negative   Assessment and Plan: Virginia Larson is a 65 y.o. female who is here today for cc of right foot pain, hot flashes, and sinus pain. -this appears to be a tendinopathy.  Advised mobic, ice and stretches -restarting her on metformin.  Advised that she return in 3 months for recheck. -sinus issue consistent with virus and we will treat supportively at this time.  -refill hctz    Right foot pain - Plan: DG Foot Complete Right  Glucose intolerance (impaired glucose tolerance) - Plan: COMPLETE METABOLIC PANEL WITH GFR, POCT glycosylated hemoglobin (Hb A1C)  Essential hypertension - Plan: COMPLETE METABOLIC PANEL WITH GFR  Hot flashes - Plan: venlafaxine (EFFEXOR) 37.5 MG tablet  Type 2 diabetes mellitus with hyperglycemia, without long-term current use of insulin (HCC) - Plan: metFORMIN (GLUCOPHAGE) 500 MG tablet  Viral URI - Plan: Guaifenesin (MUCINEX MAXIMUM STRENGTH) 1200 MG TB12  Right hand pain - Plan: meloxicam (MOBIC) 7.5 MG tablet  Trena Platt, PA-C [facility] 09/16/2015 12:16 PM

## 2015-09-16 NOTE — Patient Instructions (Addendum)
Please start this medication for the diabetes.  Do the exercises of 4 times per week of 30 minutes of aerobic activity.   Below are recommended foods for diabetes as well.   Please follow up in 3 months for follow up of the diabetes. Please use the bandage and ice the foot following exercises.  Choose 3 of these exercises per day to do. Please do not take ibuprofen or naproxen with the mobic.  You can take tylenol.    Generic Ankle Exercises EXERCISES RANGE OF MOTION (ROM) AND STRETCHING EXERCISES These exercises may help you when beginning to rehabilitate your injury. Your symptoms may resolve with or without further involvement from your physician, physical therapist or athletic trainer. While completing these exercises, remember:   Restoring tissue flexibility helps normal motion to return to the joints. This allows healthier, less painful movement and activity.  An effective stretch should be held for at least 30 seconds.  A stretch should never be painful. You should only feel a gentle lengthening or release in the stretched tissue. RANGE OF MOTION - Dorsi/Plantar Flexion  While sitting with your right / left knee straight, draw the top of your foot upwards by flexing your ankle. Then reverse the motion, pointing your toes downward.  Hold each position for __________ seconds.  After completing your first set of exercises, repeat this exercise with your knee bent. Repeat __________ times. Complete this exercise __________ times per day.  RANGE OF MOTION - Ankle Alphabet  Imagine your right / left big toe is a pen.  Keeping your hip and knee still, write out the entire alphabet with your "pen." Make the letters as large as you can without increasing any discomfort. Repeat __________ times. Complete this exercise __________ times per day.  RANGE OF MOTION - Ankle Dorsiflexion, Active Assisted   Remove shoes and sit on a chair that is preferably not on a carpeted surface.  Place  right / left foot under knee. Extend your opposite leg for support.  Keeping your heel down, slide your right / left foot back toward the chair until you feel a stretch at your ankle or calf. If you do not feel a stretch, slide your bottom forward to the edge of the chair while still keeping your heel down.  Hold this stretch for __________ seconds. Repeat __________ times. Complete this stretch __________ times per day.  STRENGTHENING EXERCISES  These exercises may help you when beginning to rehabilitate your injury. They may resolve your symptoms with or without further involvement from your physician, physical therapist or athletic trainer. While completing these exercises, remember:   Muscles can gain both the endurance and the strength needed for everyday activities through controlled exercises.  Complete these exercises as instructed by your physician, physical therapist or athletic trainer. Progress the resistance and repetitions only as guided.  You may experience muscle soreness or fatigue, but the pain or discomfort you are trying to eliminate should never worsen during these exercises. If this pain does worsen, stop and make certain you are following the directions exactly. If the pain is still present after adjustments, discontinue the exercise until you can discuss the trouble with your clinician. STRENGTH - Dorsiflexors  Secure a rubber exercise band/tubing to a fixed object (table, pole) and loop the other end around your right / left foot.  Sit on the floor facing the fixed object. The band/tubing should be slightly tense when your foot is relaxed.  Slowly draw your foot back toward you  using your ankle and toes.  Hold this position for __________ seconds. Slowly release the tension in the band and return your foot to the starting position. Repeat __________ times. Complete this exercise __________ times per day.  STRENGTH - Plantar-flexors  Sit with your right / left leg  extended. Holding onto both ends of a rubber exercise band/tubing, loop it around the ball of your foot. Keep a slight tension in the band.  Slowly push your toes away from you, pointing them downward.  Hold this position for __________ seconds. Return slowly, controlling the tension in the band/tubing. Repeat __________ times. Complete this exercise __________ times per day.  STRENGTH - Ankle Eversion  Secure one end of a rubber exercise band/tubing to a fixed object (table, pole). Loop the other end around your foot just before your toes.  Place your fists between your knees. This will focus your strengthening at your ankle.  Drawing the band/tubing across your opposite foot, slowly, pull your little toe out and up. Make sure the band/tubing is positioned to resist the entire motion.  Hold this position for __________ seconds.  Have your muscles resist the band/tubing as it slowly pulls your foot back to the starting position. Repeat __________ times. Complete this exercise __________ times per day.  STRENGTH - Ankle Inversion  Secure one end of a rubber exercise band/tubing to a fixed object (table, pole). Loop the other end around your foot just before your toes.  Place your fists between your knees. This will focus your strengthening at your ankle.  Slowly, pull your big toe up and in, making sure the band/tubing is positioned to resist the entire motion.  Hold this position for __________ seconds.  Have your muscles resist the band/tubing as it slowly pulls your foot back to the starting position. Repeat __________ times. Complete this exercises __________ times per day.  STRENGTH - Towel Curls  Sit in a chair positioned on a non-carpeted surface.  Place your foot on a towel, keeping your heel on the floor.  Pull the towel toward your heel by only curling your toes. Keep your heel on the floor. If instructed by your physician, physical therapist or athletic trainer, add  weight to the end of the towel. Repeat __________ times. Complete this exercise __________ times per day. STRENGTH - Plantar-flexors, Standing  Stand with your feet shoulder width apart. Steady yourself with a wall or table using as little support as needed.  Keeping your weight evenly spread over the width of your feet, rise up on your toes.*  Hold this position for __________ seconds. Repeat __________ times. Complete this exercise __________ times per day.  *If this is too easy, shift your weight toward your right / left leg until you feel challenged. Ultimately, you may be asked to do this exercise with your right / left foot only. BALANCE - Tandem Walking  Place your uninjured foot on a line 2-4 inches wide and at least 10 feet long.  Keeping your balance without using anything for extra support, place your right / left heel directly in front of your other foot.  Slowly raise your back foot up, lifting from the heel to the toes, and place it directly in front of the right / left foot.  Continue to walk along the line slowly. Walk for ____________________ feet. Repeat ____________________ times. Complete ____________________ times per day.   This information is not intended to replace advice given to you by your health care provider. Make sure you  discuss any questions you have with your health care provider.   Document Released: 07/26/2005 Document Revised: 10/02/2014 Document Reviewed: 12/24/2008 Elsevier Interactive Patient Education [employer]

## 2015-12-16 ENCOUNTER — Other Ambulatory Visit: Payer: Self-pay

## 2015-12-16 DIAGNOSIS — E1165 Type 2 diabetes mellitus with hyperglycemia: Secondary | ICD-10-CM

## 2015-12-16 MED ORDER — METFORMIN HCL 500 MG PO TABS: 500.0000 mg | ORAL_TABLET | Freq: Two times a day (BID) | ORAL | Status: DC

## 2016-05-15 ENCOUNTER — Other Ambulatory Visit: Payer: Self-pay | Admitting: Family Medicine

## 2016-05-15 ENCOUNTER — Other Ambulatory Visit: Payer: Self-pay

## 2016-05-15 ENCOUNTER — Telehealth: Payer: Self-pay | Admitting: Family Medicine

## 2016-05-15 DIAGNOSIS — Z1231 Encounter for screening mammogram for malignant neoplasm of breast: Secondary | ICD-10-CM

## 2016-05-15 NOTE — Telephone Encounter (Signed)
Patient was calling just to find out who the last provider was that she saw in Dec of 2016 I advised patient that it was Dr Milus Glazierlauenstein

## 2016-05-18 ENCOUNTER — Ambulatory Visit
Admission: RE | Admit: 2016-05-18 | Discharge: 2016-05-18 | Disposition: A | Discharge status: Home / Self Care | Payer: Medicare Other | Source: Ambulatory Visit | Attending: Family Medicine | Admitting: Family Medicine

## 2016-05-18 DIAGNOSIS — Z1231 Encounter for screening mammogram for malignant neoplasm of breast: Secondary | ICD-10-CM

## 2016-09-30 ENCOUNTER — Ambulatory Visit (INDEPENDENT_AMBULATORY_CARE_PROVIDER_SITE_OTHER): Payer: Medicare Other | Admitting: Family Medicine

## 2016-09-30 VITALS — BP 136/84 | HR 80 | Temp 97.7°F | Resp 16 | Ht 68.0 in | Wt 205.4 lb

## 2016-09-30 DIAGNOSIS — Z13220 Encounter for screening for lipoid disorders: Secondary | ICD-10-CM | POA: Diagnosis not present

## 2016-09-30 DIAGNOSIS — E1165 Type 2 diabetes mellitus with hyperglycemia: Secondary | ICD-10-CM

## 2016-09-30 DIAGNOSIS — E119 Type 2 diabetes mellitus without complications: Secondary | ICD-10-CM | POA: Diagnosis not present

## 2016-09-30 DIAGNOSIS — H543 Unqualified visual loss, both eyes: Secondary | ICD-10-CM | POA: Diagnosis not present

## 2016-09-30 DIAGNOSIS — E042 Nontoxic multinodular goiter: Secondary | ICD-10-CM | POA: Diagnosis not present

## 2016-09-30 DIAGNOSIS — Z0000 Encounter for general adult medical examination without abnormal findings: Secondary | ICD-10-CM | POA: Diagnosis not present

## 2016-09-30 DIAGNOSIS — Z23 Encounter for immunization: Secondary | ICD-10-CM

## 2016-09-30 DIAGNOSIS — I10 Essential (primary) hypertension: Secondary | ICD-10-CM

## 2016-09-30 DIAGNOSIS — Z1211 Encounter for screening for malignant neoplasm of colon: Secondary | ICD-10-CM | POA: Diagnosis not present

## 2016-09-30 MED ORDER — HYDROCHLOROTHIAZIDE 12.5 MG PO CAPS: 12.5000 mg | ORAL_CAPSULE | Freq: Every day | ORAL | 1 refills | Status: DC

## 2016-09-30 NOTE — Patient Instructions (Addendum)
I will check your diabetes test and then determine if you need to be back on medication.  I referred you to surgeon for your thyroid,  I referred you to have colonoscopy.  I will refer you to eye care provider.  First pneumonia vaccine today, plan on next one at follow up visit.  Please return in next few weeks to discuss some of the concerns on your paperwork today and review labs.  Return to the clinic or go to the nearest emergency room if any of your symptoms worsen or new symptoms occur. Please keep a list of other concerns so we can identify most important ones to discuss next visit.    Keeping You Healthy  Get These Tests  Blood Pressure- Have your blood pressure checked by your healthcare provider at least once a year.  Normal blood pressure is 120/80.  Weight- Have your body mass index (BMI) calculated to screen for obesity.  BMI is a measure of body fat based on height and weight.  You can calculate your own BMI at https://www.west-esparza.com/  Cholesterol- Have your cholesterol checked every year.  Diabetes- Have your blood sugar checked every year if you have high blood pressure, high cholesterol, a family history of diabetes or if you are overweight.  Pap Test - Have a pap test every 1 to 5 years if you have been sexually active.  If you are older than 65 and recent pap tests have been normal you may not need additional pap tests.  In addition, if you have had a hysterectomy  for benign disease additional pap tests are not necessary.  Mammogram-Yearly mammograms are essential for early detection of breast cancer  Screening for Colon Cancer- Colonoscopy starting at age 38. Screening may begin sooner depending on your family history and other health conditions.  Follow up colonoscopy as directed by your Gastroenterologist.  Screening for Osteoporosis- Screening begins at age 86 with bone density scanning, sooner if you are at higher risk for developing Osteoporosis.  Get these  medicines  Calcium with Vitamin D- Your body requires 1200-1500 mg of Calcium a day and 6302056017 IU of Vitamin D a day.  You can only absorb 500 mg of Calcium at a time therefore Calcium must be taken in 2 or 3 separate doses throughout the day.  Hormones- Hormone therapy has been associated with increased risk for certain cancers and heart disease.  Talk to your healthcare provider about if you need relief from menopausal symptoms.  Aspirin- Ask your healthcare provider about taking Aspirin to prevent Heart Disease and Stroke.  Get these Immuniztions  Flu shot- Every fall  Pneumonia shot- Once after the age of 46; if you are younger ask your healthcare provider if you need a pneumonia shot.  Tetanus- Every ten years.  Zostavax- Once after the age of 33 to prevent shingles.  Take these steps  Don't smoke- Your healthcare provider can help you quit. For tips on how to quit, ask your healthcare provider or go to www.smokefree.gov or call 1-800 QUIT-NOW.  Be physically active- Exercise 5 days a week for a minimum of 30 minutes.  If you are not already physically active, start slow and gradually work up to 30 minutes of moderate physical activity.  Try walking, dancing, bike riding, swimming, etc.  Eat a healthy diet- Eat a variety of healthy foods such as fruits, vegetables, whole grains, low fat milk, low fat cheeses, yogurt, lean meats, chicken, fish, eggs, dried beans, tofu, etc.  For  more information go to www.thenutritionsource.org  Dental visit- Brush and floss teeth twice daily; visit your dentist twice a year.  Eye exam- Visit your Optometrist or Ophthalmologist yearly.  Drink alcohol in moderation- Limit alcohol intake to one drink or less a day.  Never drink and drive.  Depression- Your emotional health is as important as your physical health.  If you're feeling down or losing interest in things you normally enjoy, please talk to your healthcare provider.  Seat Belts- can  save your life; always wear one  Smoke/Carbon Monoxide detectors- These detectors need to be installed on the appropriate level of your home.  Replace batteries at least once a year.  Violence- If anyone is threatening or hurting you, please tell your healthcare provider.  Living Will/ Health care power of attorney- Discuss with your healthcare provider and family.   IF you received an x-ray today, you will receive an invoice from [facility]. Please contact [facility] at [phone] with questions or concerns regarding your invoice.   IF you received labwork today, you will receive an invoice from [facility]. Please contact [facility] at [phone] with questions or concerns regarding your invoice.   Our billing staff will not be able to assist you with questions regarding bills from these companies.  You will be contacted with the lab results as soon as they are available. The fastest way to get your results is to activate your My Chart account. Instructions are located on the last page of this paperwork. If you have not heard from us regarding the results in 2 weeks, please contact this office.

## 2016-09-30 NOTE — Progress Notes (Signed)
Subjective:  By signing my name below, I, [name], attest that this documentation has been prepared under the direction and in the presence of [name], MD.  Electronically Signed: Andrew Au, ED Scribe. 09/30/2016. 3:01 PM.   Patient ID: Virginia Larson, female    DOB: 1951-04-20, 66 y.o.   MRN: [id number]  HPI Chief Complaint  Patient presents with  . Annual Exam    with pap  . Medication Refill    Hydrochlorthiazide    HPI Comments: Virginia Larson is a 66 y.o. female who presents to the [facility] for an annual exam. She has a hx of HTN, DM, HLD and a goiter.   Goiter Treated by Dr.Gerkin in 2014. She had ultrasound biopsy at that time. There was no malignancy but hurlther cell feature recommended follow up with 6 month repeat US and visit. Does not appear she had repeat US yet.    Diabetes  Lab Results  Component Value Date   HGBA1C 7.2 09/16/2015   Plan for recheck blood sugar in 3 months based on 09/2015 labbs. She takes metformin 500 mg bid but last refill given in march with 3 refills only.  Pt has been off medication for a while. She had stopped taking medication due side effects of diarrhea and visual changes.   Hypertension  Lab Results  Component Value Date   CREATININE 1.06 (H) 09/16/2015   Takes HCTZ 12.5 mg qd  Cancer Screening Colonoscopy- she has never had colonoscopy.  Cervical cancer screening - hx of hysterectomy pap smear. She is unsure of last pap.  Breast cancer screening- mammogram 04/2016 was negative   Depression screening  Depression screen [facility] 2/9 09/30/2016 09/16/2015  Decreased Interest 0 0  Down, Depressed, Hopeless 0 0  PHQ - 2 Score 0 0    Immunizations  Immunization History  Administered Date(s) Administered  . Pneumococcal Polysaccharide-23 07/21/2011  . Td 03/02/2005  . Zoster 10/27/2011   Flu shot- she declines flu shot today.   prevnar- she agrees to receiving this today.   Falls:No  falls in the past year Functional status  Vision  Visual Acuity Screening   Right eye Left eye Both eyes  Without correction: 20/50-1 20/50 20/40-2  With correction:      Pt has not had an eye exam in the past year. Pt does not wear glass.   Dentist She has not seen a dentist in he past year.   Exercise  Pt does not exercise regularly. She was in an exercise program last summer.  Advance Directive Pt has a written living will.   Pt is fasting  Patient Active Problem List   Diagnosis Date Noted  . Multinodular goiter (nontoxic), dominant nodules left lobe 08/13/2013  . Essential hypertension, benign 02/07/2013  . 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 Vitamin D deficiency 04/03/2012  . Hyperlipidemia LDL goal < 100 04/03/2012  . Type II or unspecified type diabetes mellitus without mention of complication, not stated as uncontrolled 04/03/2012  . Obesity (BMI 30.0-34.9) 04/03/2012  . DJD (degenerative joint disease) 04/03/2012   Past Medical History:  Diagnosis Date  . Glucose intolerance (impaired glucose tolerance)   . Hyperlipidemia   . Hypertension   . Osteoporosis    Past Surgical History:  Procedure Laterality Date  . ABDOMINAL HYSTERECTOMY     fibroids.  Ovaries intact.  Marland Kitchen BREAST SURGERY     L lump resection; benign.   No Known Allergies Prior to Admission  medications   Medication Sig Start Date End Date Taking? Authorizing Provider  hydrochlorothiazide (MICROZIDE) 12.5 MG capsule Take 1 capsule (12.5 mg total) by mouth daily. 09/16/15  Yes Collie Siad English, PA  metFORMIN (GLUCOPHAGE) 500 MG tablet Take 1 tablet (500 mg total) by mouth 2 (two) times daily with a meal. Patient not taking: Reported on 09/30/2016 12/16/15   Garnetta Buddy, PA   Social History   Social History  . Marital status: Married    Spouse name: N/A  . Number of children: N/A  . Years of education: N/A   Occupational History  . Not on file.   Social History Main Topics  .  Smoking status: Never Smoker  . Smokeless tobacco: Not on file  . Alcohol use No  . Drug use: No  . Sexual activity: Yes    Birth control/ protection: Post-menopausal   Other Topics Concern  . Not on file   Social History Narrative   Marital: married x 19 years.      Children:  2 sons; 7 grandchildren      Lives:  With husband.      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      Tobacco: none      Alcohol: none       Exercise:  Walking.           13 point ROS, multiple complaints but no acute Review of Systems  Constitutional: Positive for diaphoresis.  HENT: Positive for postnasal drip and sinus pressure.   Eyes: Positive for visual disturbance.  Respiratory: Positive for wheezing.   Endocrine: Positive for heat intolerance.  Musculoskeletal: Positive for arthralgias, back pain and neck stiffness.  Skin:       Bump or lump on back  Neurological: Positive for numbness ( right hand occasional).  Psychiatric/Behavioral: Negative for hallucinations.      Objective:   Physical Exam  Constitutional: She is oriented to person, place, and time. She appears well-developed and well-nourished.  HENT:  Head: Normocephalic and atraumatic.  Right Ear: External ear normal.  Left Ear: External ear normal.  Mouth/Throat: Oropharynx is clear and moist.  Eyes: Conjunctivae are normal. Pupils are equal, round, and reactive to light.  Neck: Normal range of motion. Neck supple. No thyromegaly present.  Few small mobile nodules on right greater than left side.   Cardiovascular: Normal rate, regular rhythm, normal heart sounds and intact distal pulses.   No murmur heard. Pulmonary/Chest: Effort normal and breath sounds normal. No respiratory distress. She has no wheezes.  Abdominal: Soft. Bowel sounds are normal. There is no tenderness.  Musculoskeletal: Normal range of motion. She exhibits no edema or tenderness.  Lymphadenopathy:    She has no cervical adenopathy.    Neurological: She is alert and oriented to person, place, and time.  Skin: Skin is warm and dry. No rash noted.  Psychiatric: She has a normal mood and affect. Her behavior is normal. Thought content normal.  Vitals reviewed.  of thyroid gland.   Vitals:   09/30/16 1408  BP: 136/84  Pulse: 80  Resp: 16  Temp: 97.7 F (36.5 C)  TempSrc: Oral  SpO2: 98%  Weight: 205 lb 6.4 oz (93.2 kg)  Height: 5\' 8"  (1.727 m)    Assessment & Plan:   TERRALYN MATSUMURA is a 66 y.o. female Medicare annual wellness visit, initial  - -anticipatory guidance as below in AVS, screening labs above. Health maintenance items as above in HPI discussed/recommended as  applicable.   Essential hypertension - Plan: Comprehensive metabolic panel, hydrochlorothiazide (MICROZIDE) 12.5 MG capsule  - stable, no changes at this time. Labs pending.   Type 2 diabetes mellitus without complication, without long-term current use of insulin (HCC) - Plan: HM Diabetes Foot Exam, Hemoglobin A1C, Comprehensive metabolic panel, Ambulatory referral to Ophthalmology  - Check A1c to determine if medication needed. May need to look at different options if not tolerating metformin.  Multinodular goiter - Plan: TSH, Ambulatory referral to General Surgery  -Overdue for follow-up with ultrasound in general surgeon eval. New referral to Gen. surgery to determine next step and possible may need to  reorder ultrasound as previous order from a few years ago.  Screening for hyperlipidemia - Plan: Lipid panel   Special screening for malignant neoplasms, colon - Plan: Ambulatory referral to Gastroenterology  - overdue for colonoscopy. Referral placed.   Need for pneumococcal vaccination  - due for prevnar, then pneumovax.   Type 2 diabetes mellitus with hyperglycemia, without long-term current use of insulin (HCC)  - as above.   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  There are other unrelated non-urgent complaints on the ROS, but due to the amount of time already spent with other health problems and physical, time does not permit me to address these routine issues at today's visit. I've requested another appointment to review these additional issues.   Meds ordered this encounter  Medications  . hydrochlorothiazide (MICROZIDE) 12.5 MG capsule    Sig: Take 1 capsule (12.5 mg total) by mouth daily.    Dispense:  90 capsule    Refill:  1   Patient Instructions    I will check your diabetes test and then determine if you need to be back on medication.  I referred you to surgeon for your thyroid,  I referred you to have colonoscopy.  I will refer you to eye care provider.  First pneumonia vaccine today, plan on next one at follow up visit.  Please return in next few weeks to discuss some of the concerns on your paperwork today and review labs.  Return to the clinic or go to the nearest emergency room if any of your symptoms worsen or new symptoms occur. Please keep a list of other concerns so we can identify most important ones to discuss next visit.    Keeping You Healthy  Get These Tests  Blood Pressure- Have your blood pressure checked by your healthcare provider at least once a year.  Normal blood pressure is 120/80.  Weight- Have your body mass index (BMI) calculated to screen for obesity.  BMI is a measure of body fat based on height and weight.  You can calculate your own BMI at https://www.west-esparza.com/  Cholesterol- Have your cholesterol checked every year.  Diabetes- Have your blood sugar checked every year if you have high blood pressure, high cholesterol, a family history of diabetes or if you are overweight.  Pap Test - Have a pap test every 1 to 5 years if you have been sexually active.  If you are older than 65 and  recent pap tests have been normal you may not need additional pap tests.  In addition, if you have had a hysterectomy  for benign disease additional pap tests are not necessary.  Mammogram-Yearly mammograms are essential for early detection  of breast cancer  Screening for Colon Cancer- Colonoscopy starting at age 67. Screening may begin sooner depending on your family history and other health conditions.  Follow up colonoscopy as directed by your Gastroenterologist.  Screening for Osteoporosis- Screening begins at age 67 with bone density scanning, sooner if you are at higher risk for developing Osteoporosis.  Get these medicines  Calcium with Vitamin D- Your body requires 1200-1500 mg of Calcium a day and [id number] IU of Vitamin D a day.  You can only absorb 500 mg of Calcium at a time therefore Calcium must be taken in 2 or 3 separate doses throughout the day.  Hormones- Hormone therapy has been associated with increased risk for certain cancers and heart disease.  Talk to your healthcare provider about if you need relief from menopausal symptoms.  Aspirin- Ask your healthcare provider about taking Aspirin to prevent Heart Disease and Stroke.  Get these Immuniztions  Flu shot- Every fall  Pneumonia shot- Once after the age of 67; if you are younger ask your healthcare provider if you need a pneumonia shot.  Tetanus- Every ten years.  Zostavax- Once after the age of 67 to prevent shingles.  Take these steps  Don't smoke- Your healthcare provider can help you quit. For tips on how to quit, ask your healthcare provider or go to www.smokefree.gov or call 1-800 QUIT-NOW.  Be physically active- Exercise 5 days a week for a minimum of 30 minutes.  If you are not already physically active, start slow and gradually work up to 30 minutes of moderate physical activity.  Try walking, dancing, bike riding, swimming, etc.  Eat a healthy diet- Eat a variety of healthy foods such as fruits,  vegetables, whole grains, low fat milk, low fat cheeses, yogurt, lean meats, chicken, fish, eggs, dried beans, tofu, etc.  For more information go to www.thenutritionsource.org  Dental visit- Brush and floss teeth twice daily; visit your dentist twice a year.  Eye exam- Visit your Optometrist or Ophthalmologist yearly.  Drink alcohol in moderation- Limit alcohol intake to one drink or less a day.  Never drink and drive.  Depression- Your emotional health is as important as your physical health.  If you're feeling down or losing interest in things you normally enjoy, please talk to your healthcare provider.  Seat Belts- can save your life; always wear one  Smoke/Carbon Monoxide detectors- These detectors need to be installed on the appropriate level of your home.  Replace batteries at least once a year.  Violence- If anyone is threatening or hurting you, please tell your healthcare provider.  Living Will/ Health care power of attorney- Discuss with your healthcare provider and family.   IF you received an x-ray today, you will receive an invoice from [facility]. Please contact [facility] at [phone] with questions or concerns regarding your invoice.   IF you received labwork today, you will receive an invoice from [facility]. Please contact [facility] at [phone] with questions or concerns regarding your invoice.   Our billing staff will not be able to assist you with questions regarding bills from these companies.  You will be contacted with the lab results as soon as they are available. The fastest way to get your results is to activate your My Chart account. Instructions are located on the last page of this paperwork. If you have not heard from us regarding the results in 2 weeks, please contact this office.        I personally performed the  services described in this documentation, which was scribed in my presence. The recorded information has been reviewed  and considered, and addended by me as needed.   Signed,   [name], MD [facility].  10/01/16 3:06 PM

## 2016-10-01 LAB — COMPREHENSIVE METABOLIC PANEL
A/G RATIO: 1.6 (ref 1.2–2.2)
ALT: 12 IU/L (ref 0–32)
AST: 12 IU/L (ref 0–40)
Albumin: 4.3 g/dL (ref 3.6–4.8)
Alkaline Phosphatase: 103 IU/L (ref 39–117)
BILIRUBIN TOTAL: 0.5 mg/dL (ref 0.0–1.2)
BUN / CREAT RATIO: 13 (ref 12–28)
BUN: 12 mg/dL (ref 8–27)
CHLORIDE: 104 mmol/L (ref 96–106)
CO2: 26 mmol/L (ref 18–29)
Calcium: 9.9 mg/dL (ref 8.7–10.3)
Creatinine, Ser: 0.89 mg/dL (ref 0.57–1.00)
GFR calc non Af Amer: 68 mL/min/{1.73_m2} (ref >59)
GFR, EST AFRICAN AMERICAN: 78 mL/min/{1.73_m2} (ref >59)
GLOBULIN, TOTAL: 2.7 g/dL (ref 1.5–4.5)
Glucose: 116 mg/dL — ABNORMAL HIGH (ref 65–99)
POTASSIUM: 4.2 mmol/L (ref 3.5–5.2)
SODIUM: 144 mmol/L (ref 134–144)
TOTAL PROTEIN: 7 g/dL (ref 6.0–8.5)

## 2016-10-01 LAB — HEMOGLOBIN A1C
ESTIMATED AVERAGE GLUCOSE: 157 mg/dL
Hgb A1c MFr Bld: 7.1 % — ABNORMAL HIGH (ref 4.8–5.6)

## 2016-10-01 LAB — LIPID PANEL
CHOL/HDL RATIO: 2.8 ratio (ref 0.0–4.4)
Cholesterol, Total: 192 mg/dL (ref 100–199)
HDL: 69 mg/dL (ref >39)
LDL CALC: 106 mg/dL — AB (ref 0–99)
Triglycerides: 84 mg/dL (ref 0–149)
VLDL Cholesterol Cal: 17 mg/dL (ref 5–40)

## 2016-10-01 LAB — TSH: TSH: 0.799 u[IU]/mL (ref 0.450–4.500)

## 2016-10-03 ENCOUNTER — Encounter: Payer: Self-pay | Admitting: Gastroenterology

## 2016-10-05 LAB — HM DIABETES EYE EXAM

## 2016-10-14 ENCOUNTER — Telehealth: Payer: Self-pay | Admitting: Emergency Medicine

## 2016-10-14 NOTE — Telephone Encounter (Signed)
-----   Message from Shade FloodJeffrey R Greene, MD sent at 10/07/2016 11:41 AM EST ----- Call patient. A1c was just above goal.  Can try metformin once per day to see if tolerated at that dose. In still with GI upset, then work on diet/activity and recheck A1c in 3 months for possible med change if still elevated.  Electrolytes and cholesterol overall in normal range.  Let me know if there are questions.

## 2016-10-18 ENCOUNTER — Encounter: Payer: Self-pay | Admitting: *Deleted

## 2016-11-07 ENCOUNTER — Telehealth: Payer: Self-pay | Admitting: *Deleted

## 2016-11-07 NOTE — Telephone Encounter (Signed)
Pt no show for previsit 11/07/16. Phone call to reschedule, no answer. Message left to reschedule previsit appt by 5 pm today or colonoscopy scheduled 11/21/16 would be cancelled and both pv and colon would need to be rescheduled.

## 2016-11-07 NOTE — Telephone Encounter (Signed)
Appointment rescheduled.

## 2016-11-08 ENCOUNTER — Ambulatory Visit (AMBULATORY_SURGERY_CENTER): Payer: Self-pay

## 2016-11-08 VITALS — Ht 67.0 in | Wt 207.4 lb

## 2016-11-08 DIAGNOSIS — Z1211 Encounter for screening for malignant neoplasm of colon: Secondary | ICD-10-CM

## 2016-11-08 MED ORDER — SUPREP BOWEL PREP KIT 17.5-3.13-1.6 GM/177ML PO SOLN: 1.0000 | Freq: Once | ORAL | 0 refills | Status: AC

## 2016-11-08 NOTE — Progress Notes (Signed)
No allergies to eggs or soy No diet meds No home oxygen No past problems with anesthesia  Registered for emmi 

## 2016-11-09 ENCOUNTER — Encounter: Payer: Self-pay | Admitting: Gastroenterology

## 2016-11-21 ENCOUNTER — Encounter: Payer: Medicare Other | Admitting: Gastroenterology

## 2017-03-31 ENCOUNTER — Other Ambulatory Visit: Payer: Self-pay | Admitting: Family Medicine

## 2017-03-31 DIAGNOSIS — I10 Essential (primary) hypertension: Secondary | ICD-10-CM

## 2017-04-03 ENCOUNTER — Telehealth: Payer: Self-pay | Admitting: Family Medicine

## 2017-04-03 NOTE — Telephone Encounter (Signed)
Virginia Larson  - Pt came in about her high bp medication.  I told her it was filled this morning.  She is wondering why the script was not filled for a years worth of refills like normal.  Also she says when she came in last you discussed her metformin.  She said she had to quit it because of the severe diarrhea it gave her.  But the medication was never replaced with any other diabetic medication.  Please call [phone]

## 2017-04-04 NOTE — Telephone Encounter (Signed)
Please advise 

## 2017-04-06 NOTE — Telephone Encounter (Signed)
Received call from pt. Pt states that she does not have the money to schedule an appointment at this time.

## 2017-04-06 NOTE — Telephone Encounter (Signed)
Attempted to call - no voicemail

## 2017-04-06 NOTE — Telephone Encounter (Signed)
Please see my assessment and plan notes on last office visit in January as well as notes on the hemoglobin A1c labwork.   It appears that we sent an unable to reach letter regarding that lab work. Also I did want her to return to discuss the other concerns we are unable to get to during that office visit in January, and with diabetes should have office visit every 3-6 months based on level of control. She would be due for office visit at this point.   She did have diarrhea with twice per day metformin, gave her the option of once per day as A1c was only borderline elevated. That is why I did not send a new medication.   I gave her 6 months of hypertension medication as would be due for diabetes follow-up within that time. I sometimes will write a years worth of refills for hypertension medication, but is due for diabetes follow-up. Let me know if there are other questions.

## 2017-05-17 ENCOUNTER — Ambulatory Visit (INDEPENDENT_AMBULATORY_CARE_PROVIDER_SITE_OTHER): Payer: Medicare Other | Admitting: Family Medicine

## 2017-05-17 ENCOUNTER — Encounter: Payer: Self-pay | Admitting: Family Medicine

## 2017-05-17 VITALS — BP 131/83 | HR 80 | Temp 98.0°F | Resp 17 | Ht 67.0 in | Wt 197.8 lb

## 2017-05-17 DIAGNOSIS — E1165 Type 2 diabetes mellitus with hyperglycemia: Secondary | ICD-10-CM | POA: Diagnosis not present

## 2017-05-17 DIAGNOSIS — Z23 Encounter for immunization: Secondary | ICD-10-CM | POA: Diagnosis not present

## 2017-05-17 DIAGNOSIS — L723 Sebaceous cyst: Secondary | ICD-10-CM

## 2017-05-17 DIAGNOSIS — IMO0001 Reserved for inherently not codable concepts without codable children: Secondary | ICD-10-CM

## 2017-05-17 LAB — POCT GLYCOSYLATED HEMOGLOBIN (HGB A1C): Hemoglobin A1C: 7.8

## 2017-05-17 NOTE — Patient Instructions (Addendum)
After 24 hours you may remove the bandaging and clean with soap and water twice per day.  Avoid fragranted soaps, and deodorant soaps.  You can use antibacterial soap, or gold dial soap for instance.  You do not need a covering, but it may be needed, so it does not snag on clothes.  Please return in 8 days for suture removal.    Incision and Drainage, Care After Refer to this sheet in the next few weeks. These instructions provide you with information about caring for yourself after your procedure. Your health care provider may also give you more specific instructions. Your treatment has been planned according to current medical practices, but problems sometimes occur. Call your health care provider if you have any problems or questions after your procedure. What can I expect after the procedure? After the procedure, it is common to have:  Pain or discomfort around your incision site.  Drainage from your incision.  Follow these instructions at home:  Take over-the-counter and prescription medicines only as told by your health care provider.  If you were prescribed an antibiotic medicine, take it as told by your health care provider.Do not stop taking the antibiotic even if you start to feel better.  Followinstructions from your health care provider about: ? How to take care of your incision. ? When and how you should change your packing and bandage (dressing). Wash your hands with soap and water before you change your dressing. If soap and water are not available, use hand sanitizer. ? When you should remove your dressing.  Do not take baths, swim, or use a hot tub until your health care provider approves.  Keep all follow-up visits as told by your health care provider. This is important.  Check your incision area every day for signs of infection. Check for: ? More redness, swelling, or pain. ? More fluid or blood. ? Warmth. ? Pus or a bad smell. Contact a health care provider  if:  Your cyst or abscess returns.  You have a fever.  You have more redness, swelling, or pain around your incision.  You have more fluid or blood coming from your incision.  Your incision feels warm to the touch.  You have pus or a bad smell coming from your incision. Get help right away if:  You have severe pain or bleeding.  You cannot eat or drink without vomiting.  You have decreased urine output.  You become short of breath.  You have chest pain.  You cough up blood.  The area where the incision and drainage occurred becomes numb or it tingles. This information is not intended to replace advice given to you by your health care provider. Make sure you discuss any questions you have with your health care provider. Document Released: 12/04/2011 Document Revised: 02/11/2016 Document Reviewed: 07/02/2015 Elsevier Interactive Patient Education  2017 [employer].     IF you received an x-ray today, you will receive an invoice from [facility]. Please contact [facility] at [phone] with questions or concerns regarding your invoice.   IF you received labwork today, you will receive an invoice from [facility]. Please contact [facility] at [phone] with questions or concerns regarding your invoice.   Our billing staff will not be able to assist you with questions regarding bills from these companies.  You will be contacted with the lab results as soon as they are available. The fastest way to get your results is to activate your My Chart account. Instructions  are located on the last page of this paperwork. If you have not heard from [place] regarding the results in 2 weeks, please contact this office.

## 2017-05-18 LAB — MICROALBUMIN, URINE: Microalbumin, Urine: 5.1 ug/mL

## 2017-05-18 NOTE — Progress Notes (Signed)
8/24/201810:18 AM  Virginia Larson 03-25-1951, 66 y.o. female [id number]  Chief Complaint  Patient presents with  . Mass    right side of back x several years, very sore, hx of cyst on back and was lanced.    HPI:   Patient is a 66 y.o. female with past medical history significant for DM2 who presents today for worsening lump on her right upper back. It has been there for years, but for past several weeks it has been painful and bothering her. She reports that her bra strap irritates it, she cant sit straight up against church pew. She has had one similar to this in a different place and it was removed. She is wondering if this one can be removed today. She denies any drainage from it. She denies any fever or chills.  She is also a diabetic type 2. She originally had been rx metformin 1000mg  BID but she stopped taking due to GI symptoms. last a1c 7.1 in feb 2018. At that point she was instructed to take 1/2 tab (500mg ) BID but she never got a rx.   Depression screen [facility] 2/9 09/30/2016 09/16/2015  Decreased Interest 0 0  Down, Depressed, Hopeless 0 0  PHQ - 2 Score 0 0    No Known Allergies  Current Outpatient Prescriptions on File Prior to Visit  Medication Sig Dispense Refill  . hydrochlorothiazide (MICROZIDE) 12.5 MG capsule TAKE ONE CAPSULE BY MOUTH ONCE DAILY 90 capsule 0   No current facility-administered medications on file prior to visit.     Past Medical History:  Diagnosis Date  . Diabetes (HCC)   . Glucose intolerance (impaired glucose tolerance)   . Hyperlipidemia   . Hypertension   . Osteoporosis     Past Surgical History:  Procedure Laterality Date  . ABDOMINAL HYSTERECTOMY     fibroids.  Ovaries intact.  Marland Kitchen BREAST SURGERY     L lump resection; benign.  . CYSTECTOMY     on back    Social History  Substance Use Topics  . Smoking status: Never Smoker  . Smokeless tobacco: Never Used  . Alcohol use No    Family History  Problem Relation Age of  Onset  . Heart disease Mother   . Dementia Mother   . Stroke Mother   . Asthma Sister   . Hypertension Sister   . Colon cancer Neg Hx     Review of Systems  Constitutional: Negative for chills and fever.  Respiratory: Negative for cough and shortness of breath.   Cardiovascular: Negative for chest pain, palpitations and leg swelling.  Gastrointestinal: Negative for abdominal pain, nausea and vomiting.     OBJECTIVE:  Blood pressure 131/83, pulse 80, temperature 98 F (36.7 C), temperature source Oral, resp. rate 17, height 5\' 7"  (1.702 m), weight 197 lb 12.8 oz (89.7 kg), SpO2 98 %.  Physical Exam  Constitutional: She is oriented to person, place, and time and well-developed, well-nourished, and in no distress.  HENT:  Head: Normocephalic and atraumatic.  Mouth/Throat: Oropharynx is clear and moist. No oropharyngeal exudate.  Eyes: Pupils are equal, round, and reactive to light. EOM are normal. No scleral icterus.  Neck: Neck supple.  Cardiovascular: Normal rate, regular rhythm and normal heart sounds.  Exam reveals no gallop and no friction rub.   No murmur heard. Pulmonary/Chest: Effort normal and breath sounds normal. She has no wheezes. She has no rales.  Musculoskeletal: She exhibits no edema.  Neurological: She is alert  and oriented to person, place, and time. Gait normal.  Skin: Skin is warm and dry.  Right upper back with an ~ 4 inches wide protruding soft cystic structure, no overlying erythema or warmth.     Results for orders placed or performed in visit on 05/17/17  POCT glycosylated hemoglobin (Hb A1C)  Result Value Ref Range   Hemoglobin A1C 7.8      ASSESSMENT and PLAN:  1. Uncontrolled type 2 diabetes mellitus without complication, without long-term current use of insulin (HCC) Labs ordered today. She will fu with me 1 week to further discuss DM care.  - POCT glycosylated hemoglobin (Hb A1C) - Microalbumin, urine  2. Need for  vaccination Immunizations given today. - influenza - Tdap - Prevnar 13, discussed pneumovax 23 in 1 year  3. Sebaceous Cyst Removed by S. English, PA-C. Please see procedure note for further details.      Myles Lipps, MD Primary Care at [facility] [address] [place], [place] [address] Ph.  [phone] Fax [phone]

## 2017-05-21 NOTE — Progress Notes (Signed)
PROCEDURE NOTE: verbal consent obtained.  Right side of upper posterior back cleansed with alcohol swab.  1% lidocaine with epinephrine injected at the sebaceous cyst.  11 blade utilized to place a 4cm incision across the sebaceous sac.  Generous sebaceous material expressed.  Forceps utilized to ply the sac from the normal healthy tissue.  Removed with iris scissors.  Irrigated with normal saline.  Area searched for materials.  Closed with 5-0 ethilon vertical mattress sutures and simple interrupted.  Cleansed with normal saline and dressing applied.

## 2017-06-04 ENCOUNTER — Ambulatory Visit (INDEPENDENT_AMBULATORY_CARE_PROVIDER_SITE_OTHER): Payer: Medicare Other | Admitting: Physician Assistant

## 2017-06-04 VITALS — Temp 98.5°F

## 2017-06-04 DIAGNOSIS — Z4802 Encounter for removal of sutures: Secondary | ICD-10-CM

## 2017-06-04 DIAGNOSIS — L723 Sebaceous cyst: Secondary | ICD-10-CM

## 2017-06-05 NOTE — Progress Notes (Signed)
Chief Complaint  Patient presents with  . Suture / Staple Removal    Patient is here for suture removal of a sebaceous cyst status post incision and drainage.  Patient is here today for suture removal on day 18, though instructed to return in 8 days.  She states that she was out of town.  No fever, or pain to the area.  She has been keeping it clean.  She has not submerged in still wtaer.    Physical Exam  Constitutional: She is oriented to person, place, and time. She appears well-developed and well-nourished. No distress.  HENT:  Head: Normocephalic and atraumatic.  Cardiovascular: Normal rate.   Pulmonary/Chest: Effort normal. No respiratory distress.  Neurological: She is alert and oriented to person, place, and time.  Skin: Skin is warm and dry. Capillary refill takes less than 2 seconds. She is not diaphoretic.  Sutures intact and no erythema or skin thickening around the sutures.    Psychiatric: She has a normal mood and affect. Her behavior is normal.   Assessment  sutures removed without complication Rtc as needed Sebaceous cyst  Visit for suture removal  Trena PlattStephanie English, PA-C [facility] 9/12/20184:46 PM .

## 2017-06-28 ENCOUNTER — Other Ambulatory Visit: Payer: Self-pay | Admitting: Family Medicine

## 2017-06-28 DIAGNOSIS — I10 Essential (primary) hypertension: Secondary | ICD-10-CM

## 2017-12-25 ENCOUNTER — Encounter: Payer: Self-pay | Admitting: Physician Assistant

## 2018-01-03 ENCOUNTER — Other Ambulatory Visit: Payer: Self-pay | Admitting: Family Medicine

## 2018-01-03 DIAGNOSIS — Z1239 Encounter for other screening for malignant neoplasm of breast: Secondary | ICD-10-CM

## 2018-01-09 ENCOUNTER — Ambulatory Visit
Admission: RE | Admit: 2018-01-09 | Discharge: 2018-01-09 | Disposition: A | Discharge status: Home / Self Care | Payer: Medicare Other | Source: Ambulatory Visit | Attending: Family Medicine | Admitting: Family Medicine

## 2018-01-09 DIAGNOSIS — Z1239 Encounter for other screening for malignant neoplasm of breast: Secondary | ICD-10-CM

## 2018-11-26 ENCOUNTER — Other Ambulatory Visit: Payer: Self-pay | Admitting: Family Medicine

## 2018-11-26 ENCOUNTER — Other Ambulatory Visit: Payer: Self-pay | Admitting: Otolaryngology

## 2018-11-26 DIAGNOSIS — Z1231 Encounter for screening mammogram for malignant neoplasm of breast: Secondary | ICD-10-CM

## 2019-01-13 ENCOUNTER — Ambulatory Visit: Payer: Medicare Other

## 2019-03-04 ENCOUNTER — Ambulatory Visit: Payer: Medicare Other

## 2019-03-20 ENCOUNTER — Ambulatory Visit
Admission: RE | Admit: 2019-03-20 | Discharge: 2019-03-20 | Disposition: A | Discharge status: Home / Self Care | Payer: Medicare Other | Source: Ambulatory Visit | Attending: Family Medicine | Admitting: Family Medicine

## 2019-03-20 ENCOUNTER — Other Ambulatory Visit: Payer: Self-pay

## 2019-03-20 DIAGNOSIS — Z1231 Encounter for screening mammogram for malignant neoplasm of breast: Secondary | ICD-10-CM

## 2019-03-20 IMAGING — MG DIGITAL SCREENING BILATERAL MAMMOGRAM WITH TOMO AND CAD
8 series · 8 of 24 positions shown · non-contrast
Comparison: Previous exam(s).

CLINICAL DATA: Screening.

EXAM:
DIGITAL SCREENING BILATERAL MAMMOGRAM WITH TOMO AND CAD

[R CC synth-2D]
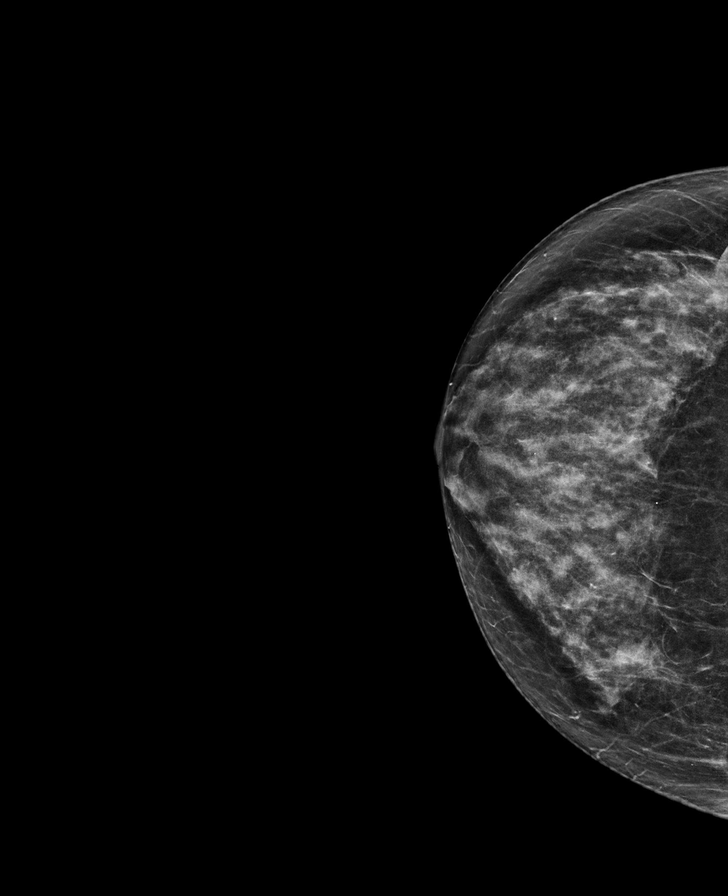

[L CC synth-2D]
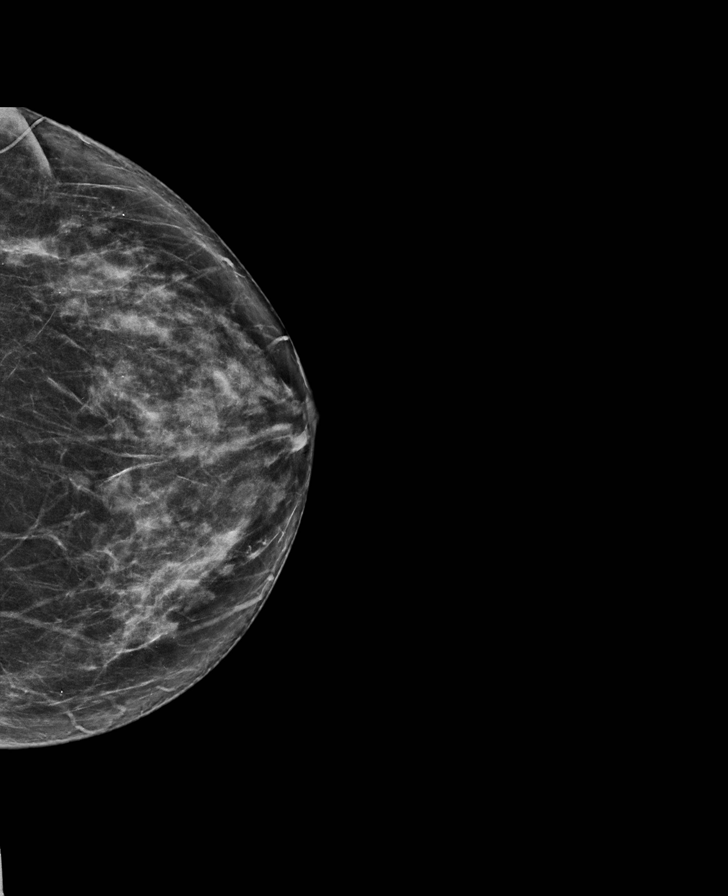

[R MLO synth-2D]
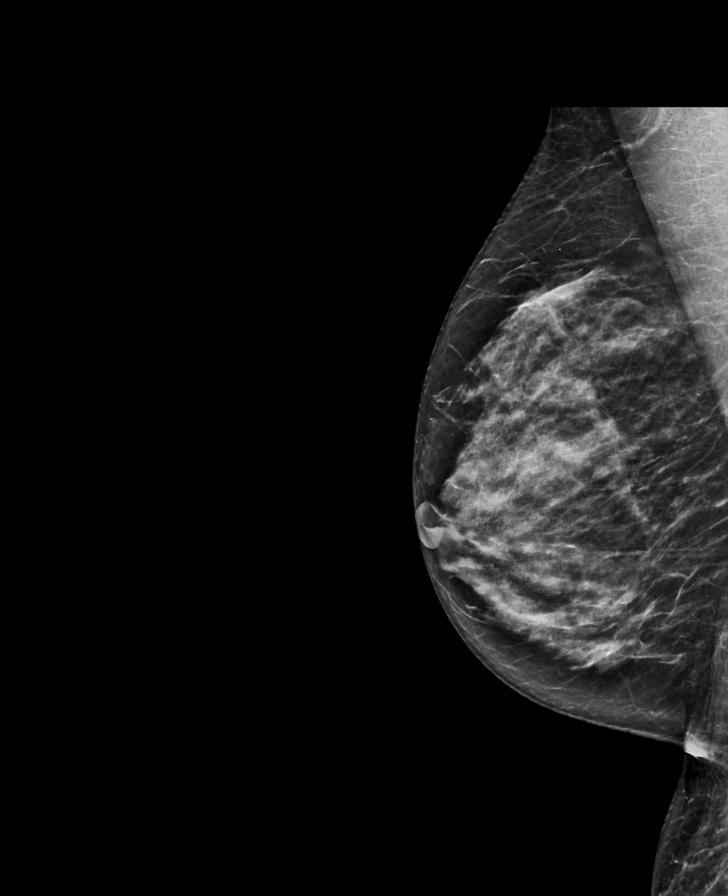

[L MLO synth-2D]
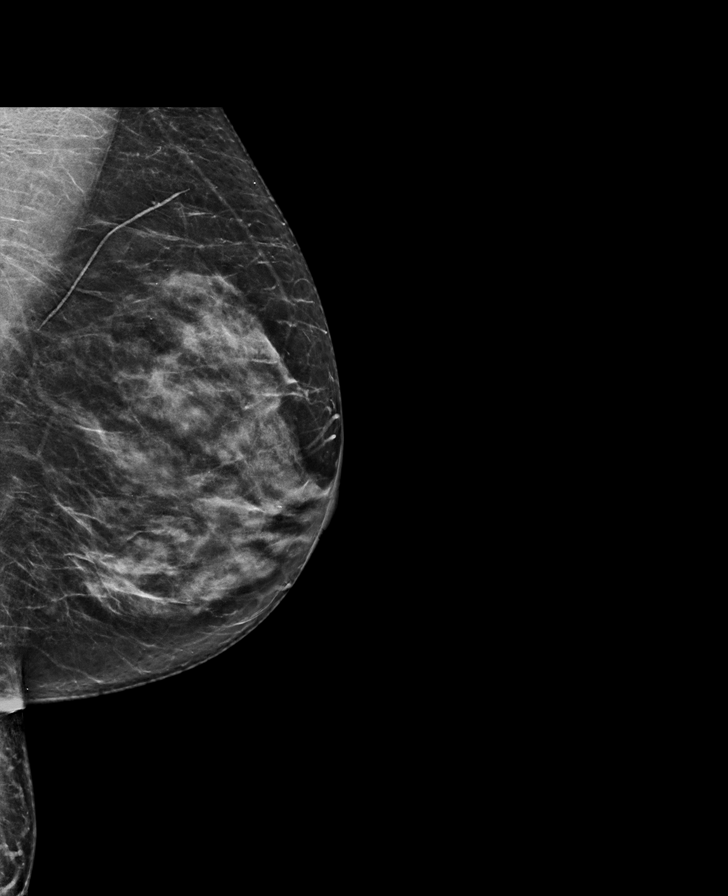

[L MLO tomo · tomo slice 36/71.0]
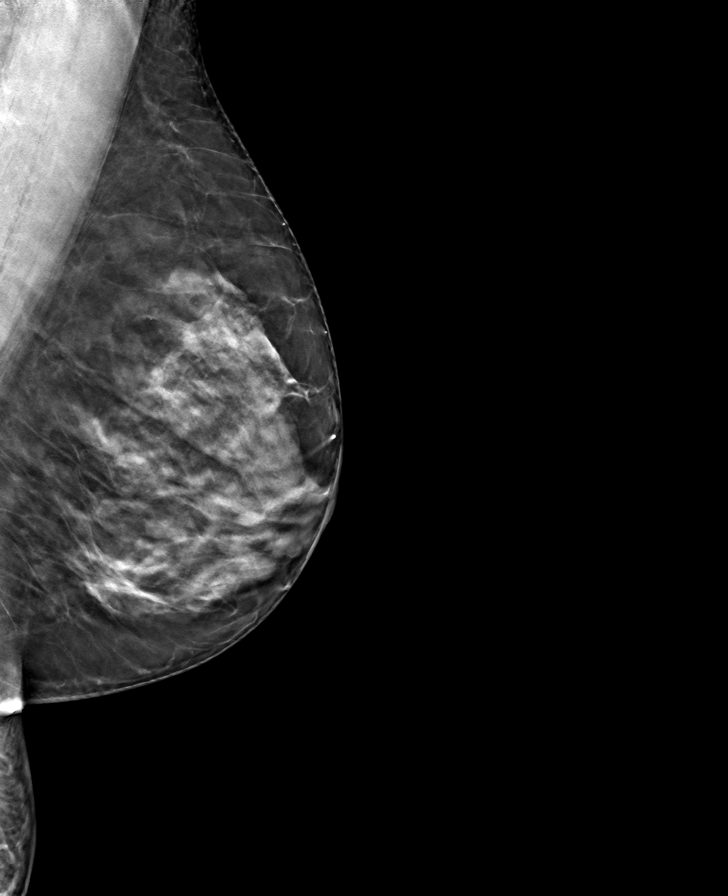

[R MLO tomo · tomo slice 37/72.0]
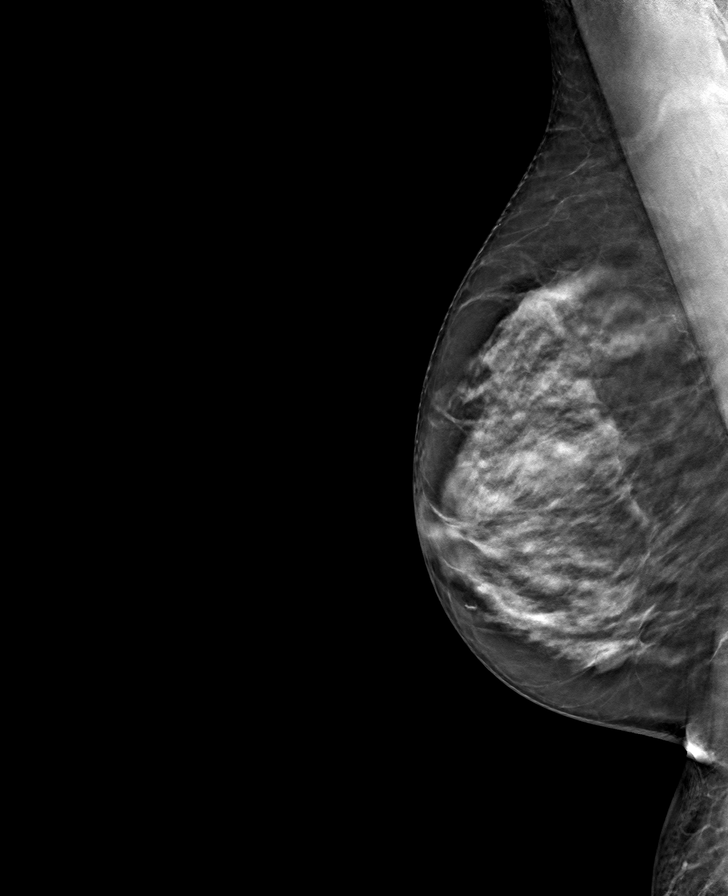

[L CC tomo · tomo slice 37/73.0]
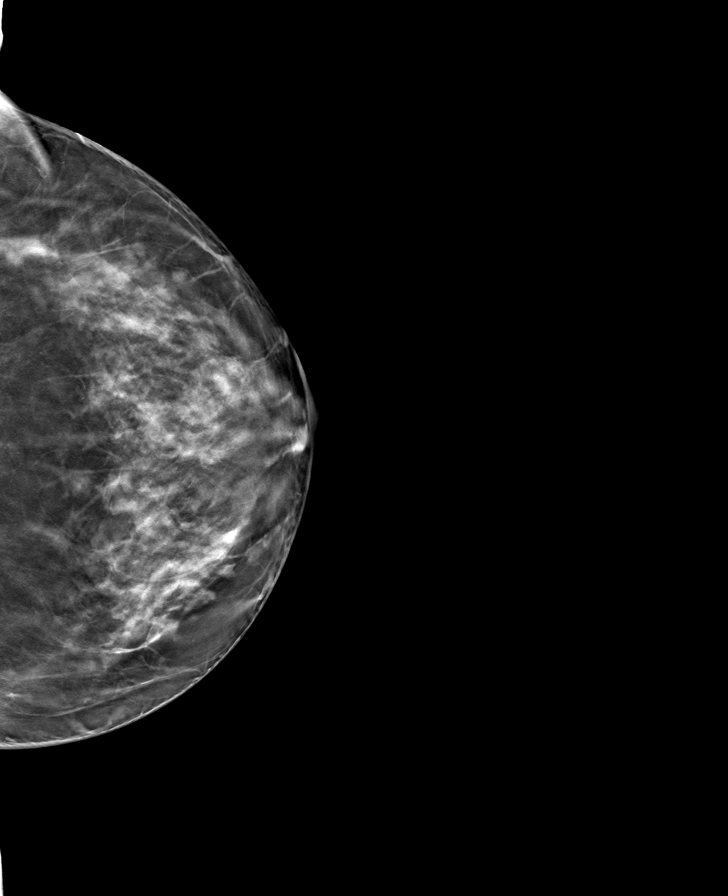

[R CC tomo · tomo slice 35/68.0]
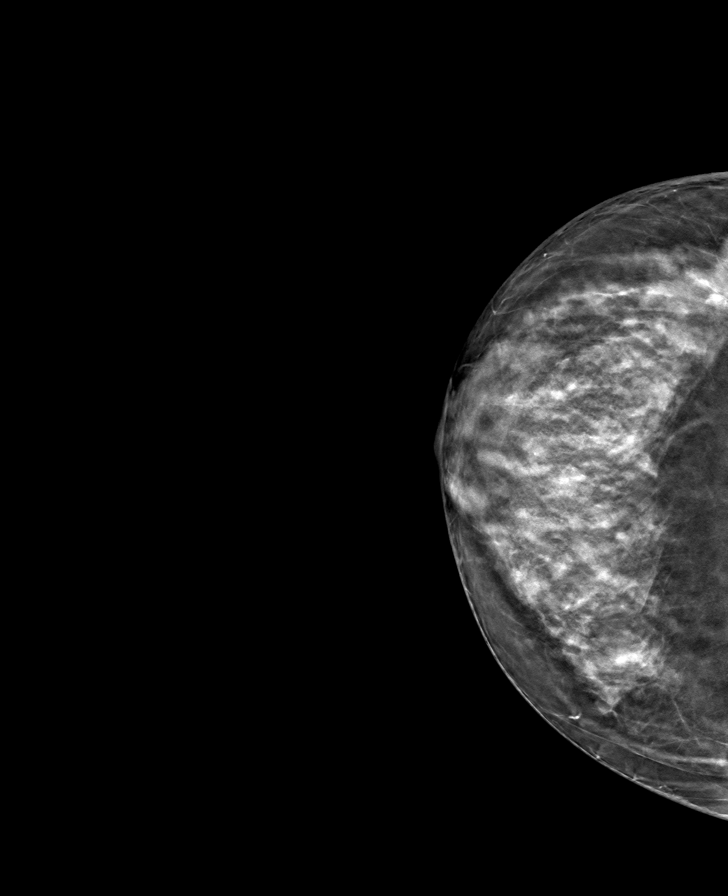

[8 of 24 positions shown; findings below may reference images not displayed]

ACR Breast Density Category c: The breast tissue is heterogeneously
dense, which may obscure small masses.
FINDINGS: There are no findings suspicious for malignancy. Images were
processed with CAD.
IMPRESSION: No mammographic evidence of malignancy. A result letter of this
screening mammogram will be mailed directly to the patient.

RECOMMENDATION:
Screening mammogram in one year. (Code:[5V])

BI-RADS CATEGORY  1: Negative.

## 2019-05-07 ENCOUNTER — Other Ambulatory Visit: Payer: Self-pay

## 2019-05-07 DIAGNOSIS — Z20822 Contact with and (suspected) exposure to covid-19: Secondary | ICD-10-CM

## 2019-05-08 LAB — NOVEL CORONAVIRUS, NAA: SARS-CoV-2, NAA: NOT DETECTED

## 2019-11-14 ENCOUNTER — Other Ambulatory Visit (HOSPITAL_COMMUNITY): Payer: Self-pay | Admitting: Family Medicine

## 2019-11-14 DIAGNOSIS — I77819 Aortic ectasia, unspecified site: Secondary | ICD-10-CM

## 2019-11-14 DIAGNOSIS — R9389 Abnormal findings on diagnostic imaging of other specified body structures: Secondary | ICD-10-CM

## 2019-11-24 ENCOUNTER — Ambulatory Visit (HOSPITAL_COMMUNITY)
Admission: RE | Admit: 2019-11-24 | Discharge: 2019-11-24 | Disposition: A | Discharge status: Home / Self Care | Payer: Medicare PPO | Source: Ambulatory Visit | Attending: Family Medicine | Admitting: Family Medicine

## 2019-11-24 ENCOUNTER — Other Ambulatory Visit: Payer: Self-pay

## 2019-11-24 ENCOUNTER — Encounter (HOSPITAL_COMMUNITY): Payer: Self-pay

## 2019-11-24 DIAGNOSIS — R9389 Abnormal findings on diagnostic imaging of other specified body structures: Secondary | ICD-10-CM | POA: Diagnosis present

## 2019-11-24 DIAGNOSIS — I77819 Aortic ectasia, unspecified site: Secondary | ICD-10-CM | POA: Diagnosis present

## 2019-11-24 LAB — POCT I-STAT CREATININE: Creatinine, Ser: 1.1 mg/dL — ABNORMAL HIGH (ref 0.44–1.00)

## 2019-11-24 IMAGING — CT CT ANGIO CHEST
2 of 7 series · 17 of 46 positions shown · IV contrast (OMNI)
Comparison: None.

CLINICAL DATA: Evaluate possible dilatation of the descending
aorta.

EXAM:
CT ANGIOGRAPHY CHEST AND ABDOMEN
TECHNIQUE: Multidetector CT imaging of the chest and abdomen was performed
using the standard protocol during bolus administration of
intravenous contrast. Multiplanar CT image reconstructions and MIPs
were obtained to evaluate the vascular anatomy.
CONTRAST:  100mL OMNIPAQUE IOHEXOL 350 MG/ML SOLN

[Series 6: dissection 3.0 i30f 3 · axial · 0.69mm/px · z∈[+1132,+1504]mm · 14 of 144 slices shown]
[im 10/144  lung]
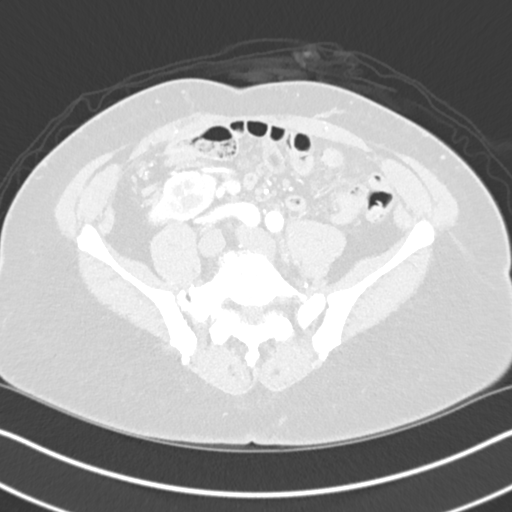
[im 20/144  soft-tissue]
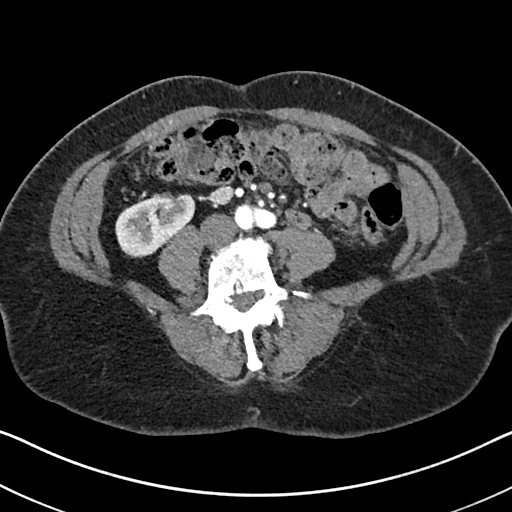
[im 29/144  lung]
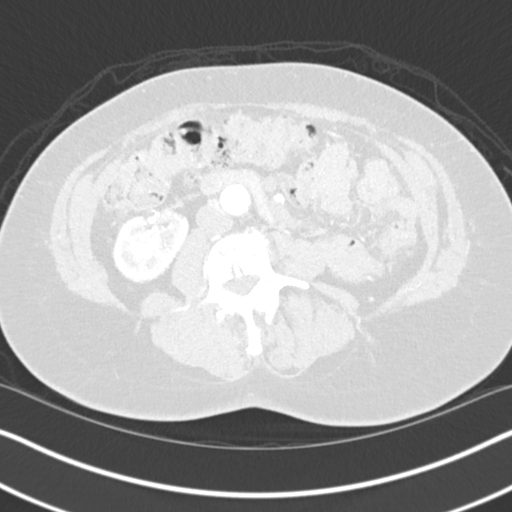
[im 39/144  soft-tissue]
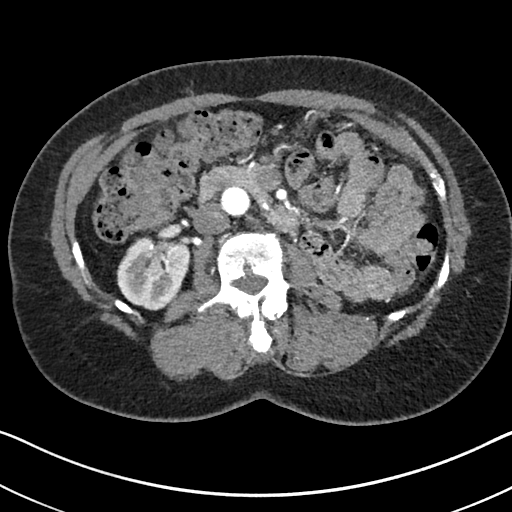
[im 48/144  lung]
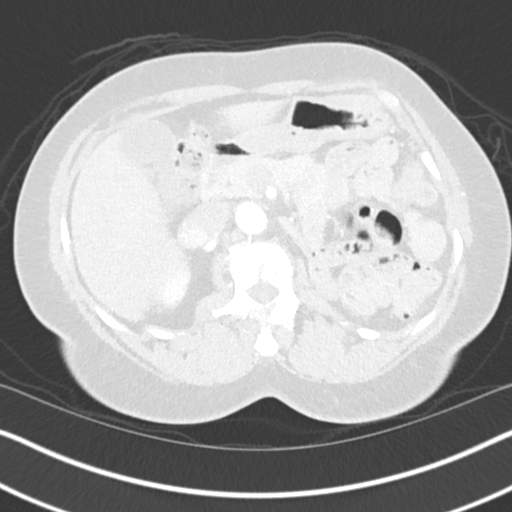
[im 58/144  soft-tissue]
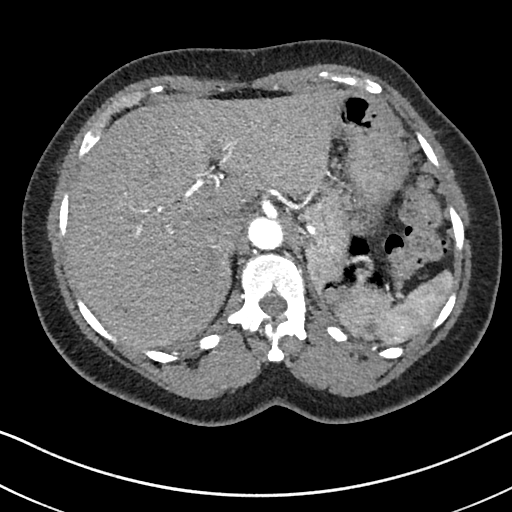
[im 67/144  lung]
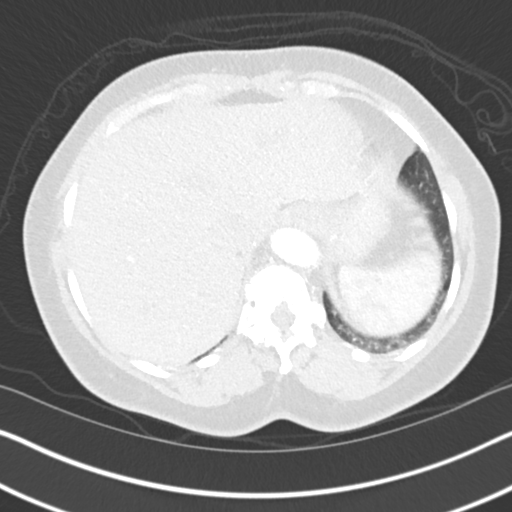
[im 77/144  soft-tissue]
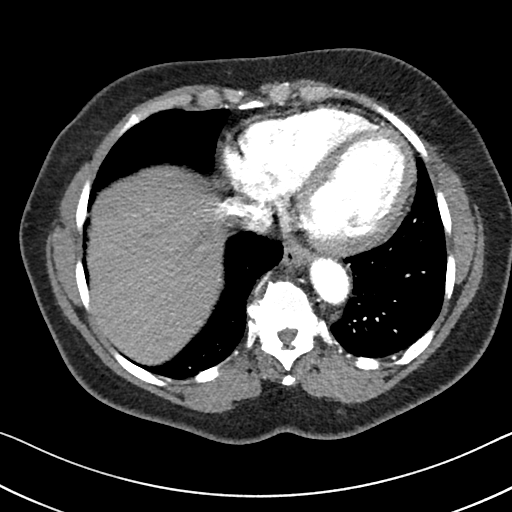
[im 86/144  lung]
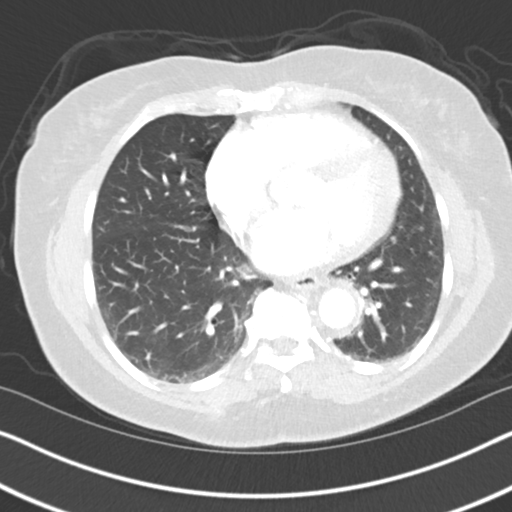
[im 96/144  soft-tissue]
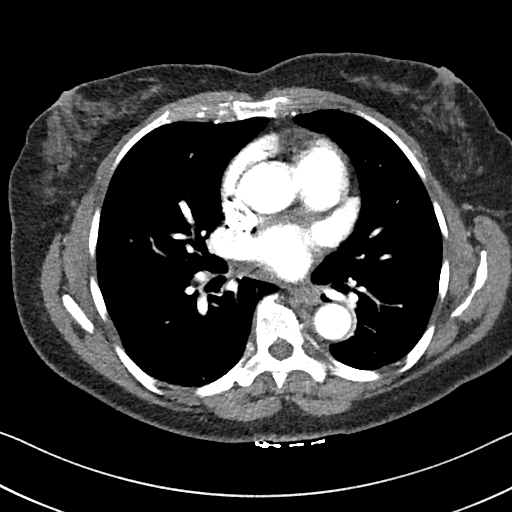
[im 105/144  lung]
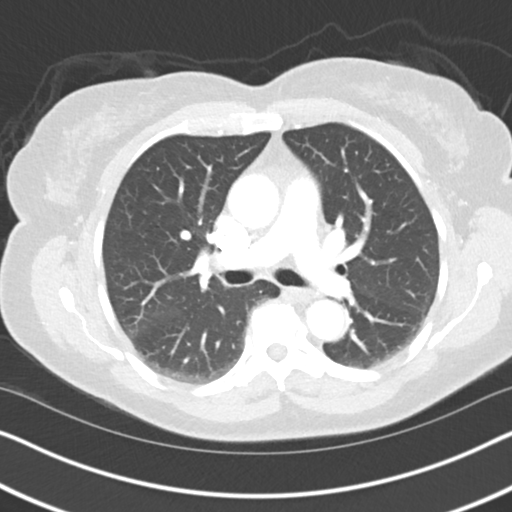
[im 115/144  soft-tissue]
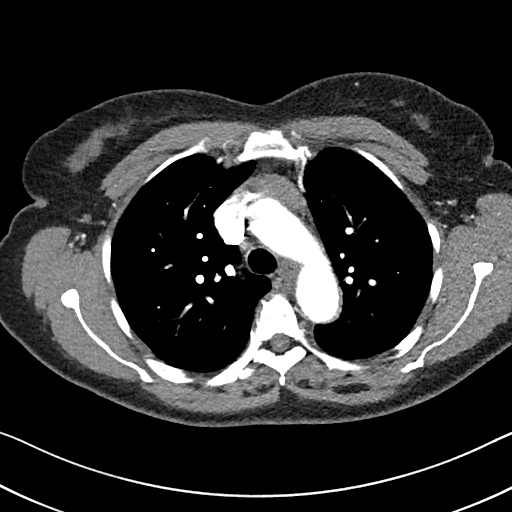
[im 124/144  lung]
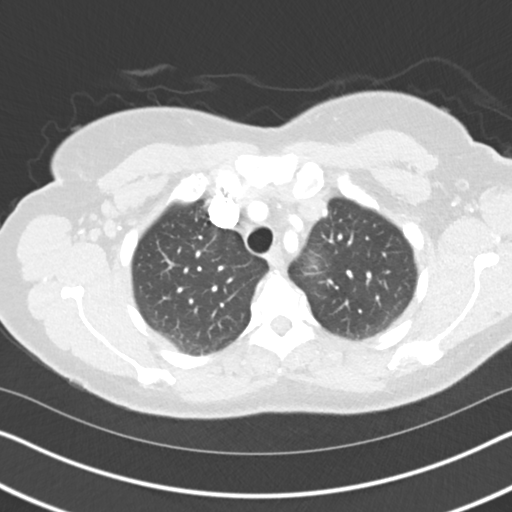
[im 134/144  soft-tissue]
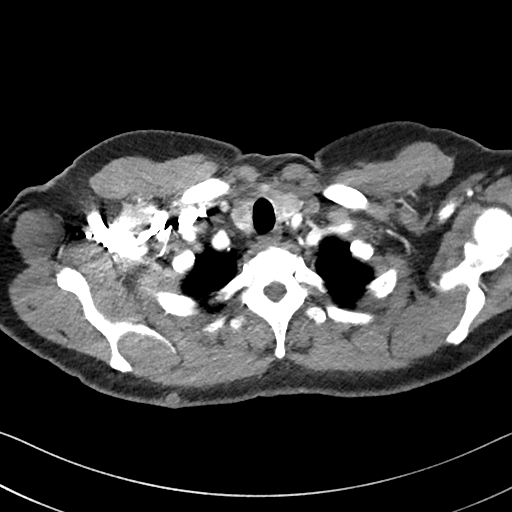

[Series 9: coronals · coronal · 0.78mm/px · 3 of 121 slices shown]
[im 31/121  soft-tissue]
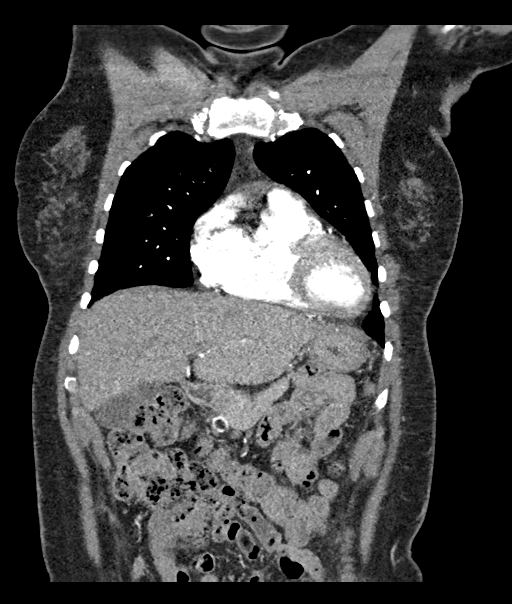
[im 61/121  soft-tissue]
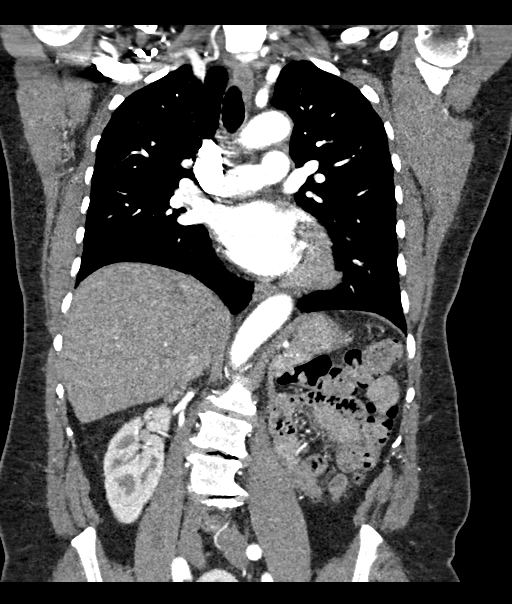
[im 91/121  soft-tissue]
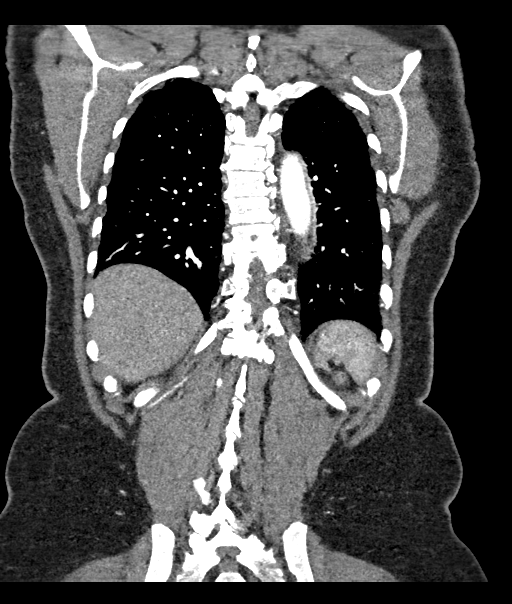

[17 of 46 positions shown; findings below may reference images not displayed]

FINDINGS: CTA CHEST FINDINGS

Cardiovascular: Heart is normal size. No evidence of aortic
aneurysm. Maximum aortic diameter in the ascending thoracic aorta is
3.5 cm. Descending thoracic aorta diameter 2.4 cm. No dissection. No
filling defects in the pulmonary arteries to suggest pulmonary
emboli.

Mediastinum/Nodes: No mediastinal, hilar, or axillary adenopathy.
Trachea and esophagus are unremarkable. Several small low-density
nodules in the thyroid bilaterally, 5 mm or less in size. No
followup recommended (ref: [HOSPITAL]. [DATE]):

Lungs/Pleura: Lungs are clear. No focal airspace opacities or
suspicious nodules. No effusions.

Musculoskeletal: Chest wall soft tissues are unremarkable. No acute
bony abnormality. Degenerative spurring in the thoracic spine.

Review of the MIP images confirms the above findings.

CTA ABDOMEN may FINDINGS

VASCULAR

Aorta: Normal caliber with maximum diameter in the proximal
abdominal aorta, 2.3 cm. No dissection.

Celiac: Patent without evidence of aneurysm, dissection, vasculitis
or significant stenosis.

SMA: Patent without evidence of aneurysm, dissection, vasculitis or
significant stenosis.

Renals: Both renal arteries are patent without evidence of aneurysm,
dissection, vasculitis, fibromuscular dysplasia or significant
stenosis.

IMA: Patent without evidence of aneurysm, dissection, vasculitis or
significant stenosis.

Inflow: Patent without evidence of aneurysm, dissection, vasculitis
or significant stenosis.

Veins: No obvious venous abnormality within the limitations of this
arterial phase study.

Review of the MIP images confirms the above findings.

NON-VASCULAR

Hepatobiliary: No focal hepatic abnormality. Gallbladder
unremarkable.

Pancreas: No focal abnormality or ductal dilatation.

Spleen: No focal abnormality.  Normal size.

Adrenals/Urinary Tract: Crossed fused ectopia noted with the left
kidney located in the upper pelvis and conjoined with the lower pole
of the right kidney. No hydronephrosis or renal mass. No adrenal
mass.

Stomach/Bowel: Stomach, visualized large and small bowel
unremarkable.

Lymphatic: No adenopath

Other: No free fluid or free air.

Musculoskeletal: No acute bony abnormality. Degenerative changes in
the lumbar spine.

Review of the MIP images confirms the above findings.
IMPRESSION: No evidence of aortic aneurysm or dissection.

Crossed fused ectopia of the kidneys with left kidney joint to the
inferior pole of the right kidney and located in the upper pelvis.

No acute cardiopulmonary disease or findings in the abdomen.

## 2019-11-24 MED ORDER — IOHEXOL 350 MG/ML SOLN
100.0000 mL | Freq: Once | INTRAVENOUS | Status: AC | PRN
  Administered 2019-11-24: 100 mL via INTRAVENOUS

## 2020-02-20 ENCOUNTER — Other Ambulatory Visit: Payer: Self-pay | Admitting: Family Medicine

## 2020-02-20 DIAGNOSIS — Z1231 Encounter for screening mammogram for malignant neoplasm of breast: Secondary | ICD-10-CM

## 2020-03-22 ENCOUNTER — Ambulatory Visit: Payer: Medicare PPO

## 2020-03-24 ENCOUNTER — Emergency Department (HOSPITAL_COMMUNITY)
Admission: EM | Admit: 2020-03-24 | Discharge: 2020-03-25 | Disposition: A | Discharge status: Home / Self Care | Payer: Medicare PPO | Attending: Emergency Medicine | Admitting: Emergency Medicine

## 2020-03-24 ENCOUNTER — Emergency Department (HOSPITAL_COMMUNITY): Payer: Medicare PPO

## 2020-03-24 ENCOUNTER — Encounter (HOSPITAL_COMMUNITY): Payer: Self-pay | Admitting: Emergency Medicine

## 2020-03-24 DIAGNOSIS — Z79899 Other long term (current) drug therapy: Secondary | ICD-10-CM | POA: Insufficient documentation

## 2020-03-24 DIAGNOSIS — R110 Nausea: Secondary | ICD-10-CM | POA: Diagnosis not present

## 2020-03-24 DIAGNOSIS — E119 Type 2 diabetes mellitus without complications: Secondary | ICD-10-CM | POA: Insufficient documentation

## 2020-03-24 DIAGNOSIS — R1084 Generalized abdominal pain: Secondary | ICD-10-CM | POA: Insufficient documentation

## 2020-03-24 DIAGNOSIS — R197 Diarrhea, unspecified: Secondary | ICD-10-CM | POA: Insufficient documentation

## 2020-03-24 DIAGNOSIS — I10 Essential (primary) hypertension: Secondary | ICD-10-CM | POA: Diagnosis not present

## 2020-03-24 LAB — HEPATIC FUNCTION PANEL
ALT: 16 U/L (ref 0–44)
AST: 17 U/L (ref 15–41)
Albumin: 3.6 g/dL (ref 3.5–5.0)
Alkaline Phosphatase: 79 U/L (ref 38–126)
Bilirubin, Direct: 0.1 mg/dL (ref 0.0–0.2)
Indirect Bilirubin: 0.6 mg/dL (ref 0.3–0.9)
Total Bilirubin: 0.7 mg/dL (ref 0.3–1.2)
Total Protein: 6.9 g/dL (ref 6.5–8.1)

## 2020-03-24 LAB — BASIC METABOLIC PANEL
Anion gap: 10 (ref 5–15)
BUN: 27 mg/dL — ABNORMAL HIGH (ref 8–23)
CO2: 23 mmol/L (ref 22–32)
Calcium: 9.7 mg/dL (ref 8.9–10.3)
Chloride: 108 mmol/L (ref 98–111)
Creatinine, Ser: 1.33 mg/dL — ABNORMAL HIGH (ref 0.44–1.00)
GFR calc Af Amer: 47 mL/min — ABNORMAL LOW (ref >60)
GFR calc non Af Amer: 40 mL/min — ABNORMAL LOW (ref >60)
Glucose, Bld: 85 mg/dL (ref 70–99)
Potassium: 4 mmol/L (ref 3.5–5.1)
Sodium: 141 mmol/L (ref 135–145)

## 2020-03-24 LAB — LIPASE, BLOOD: Lipase: 30 U/L (ref 11–51)

## 2020-03-24 LAB — CBC
HCT: 39.3 % (ref 36.0–46.0)
Hemoglobin: 12.5 g/dL (ref 12.0–15.0)
MCH: 30.6 pg (ref 26.0–34.0)
MCHC: 31.8 g/dL (ref 30.0–36.0)
MCV: 96.3 fL (ref 80.0–100.0)
Platelets: 230 10*3/uL (ref 150–400)
RBC: 4.08 MIL/uL (ref 3.87–5.11)
RDW: 13.2 % (ref 11.5–15.5)
WBC: 9.4 10*3/uL (ref 4.0–10.5)
nRBC: 0 % (ref 0.0–0.2)

## 2020-03-24 LAB — TROPONIN I (HIGH SENSITIVITY): Troponin I (High Sensitivity): 2 ng/L (ref <18)

## 2020-03-24 IMAGING — CR DG CHEST 2V
2 series · 2 of 2 positions shown · non-contrast
Comparison: PA and lateral chest [DATE].

CLINICAL DATA: Shortness of breath and nausea since [DATE].

EXAM:
CHEST - 2 VIEW

[chest pa]
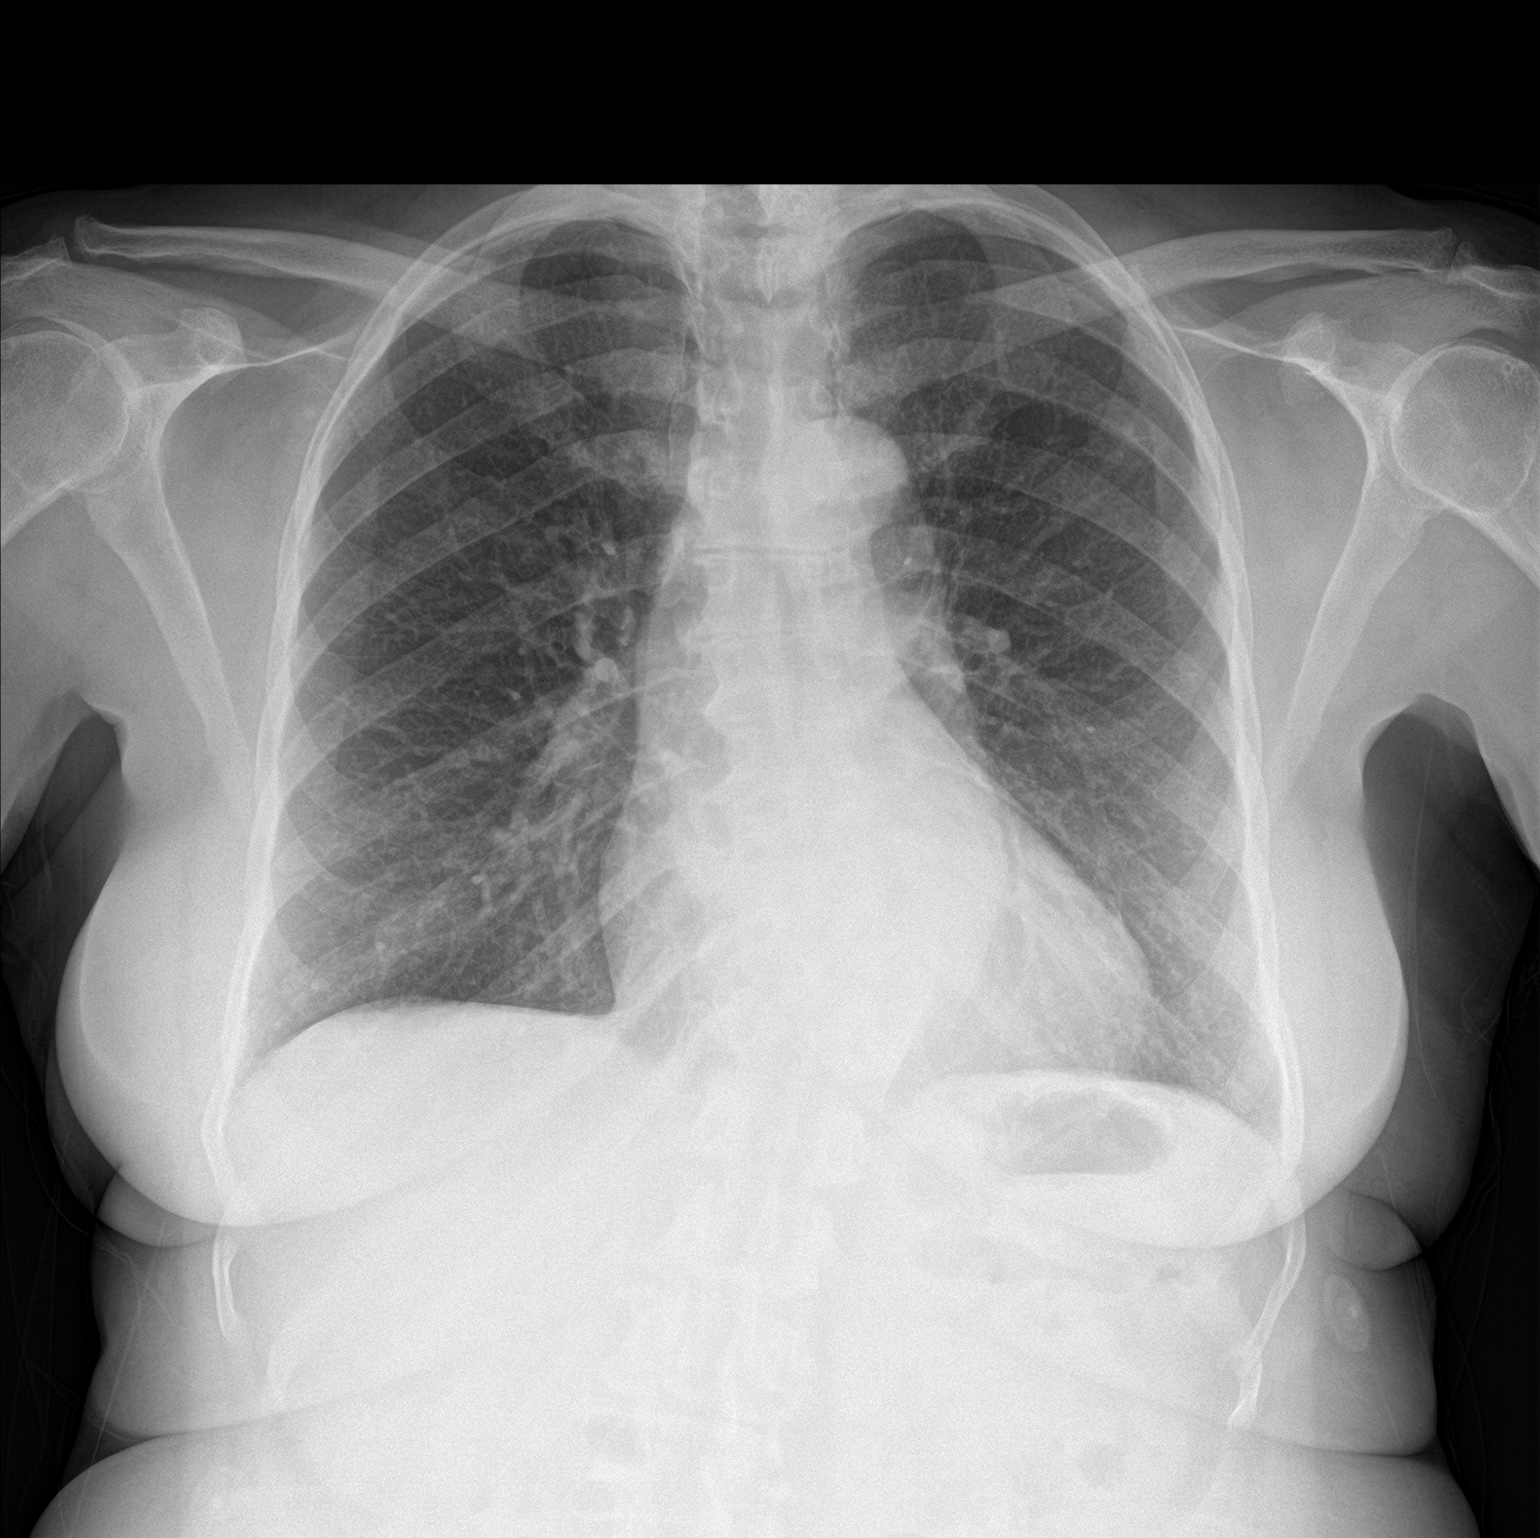

[chest lat]
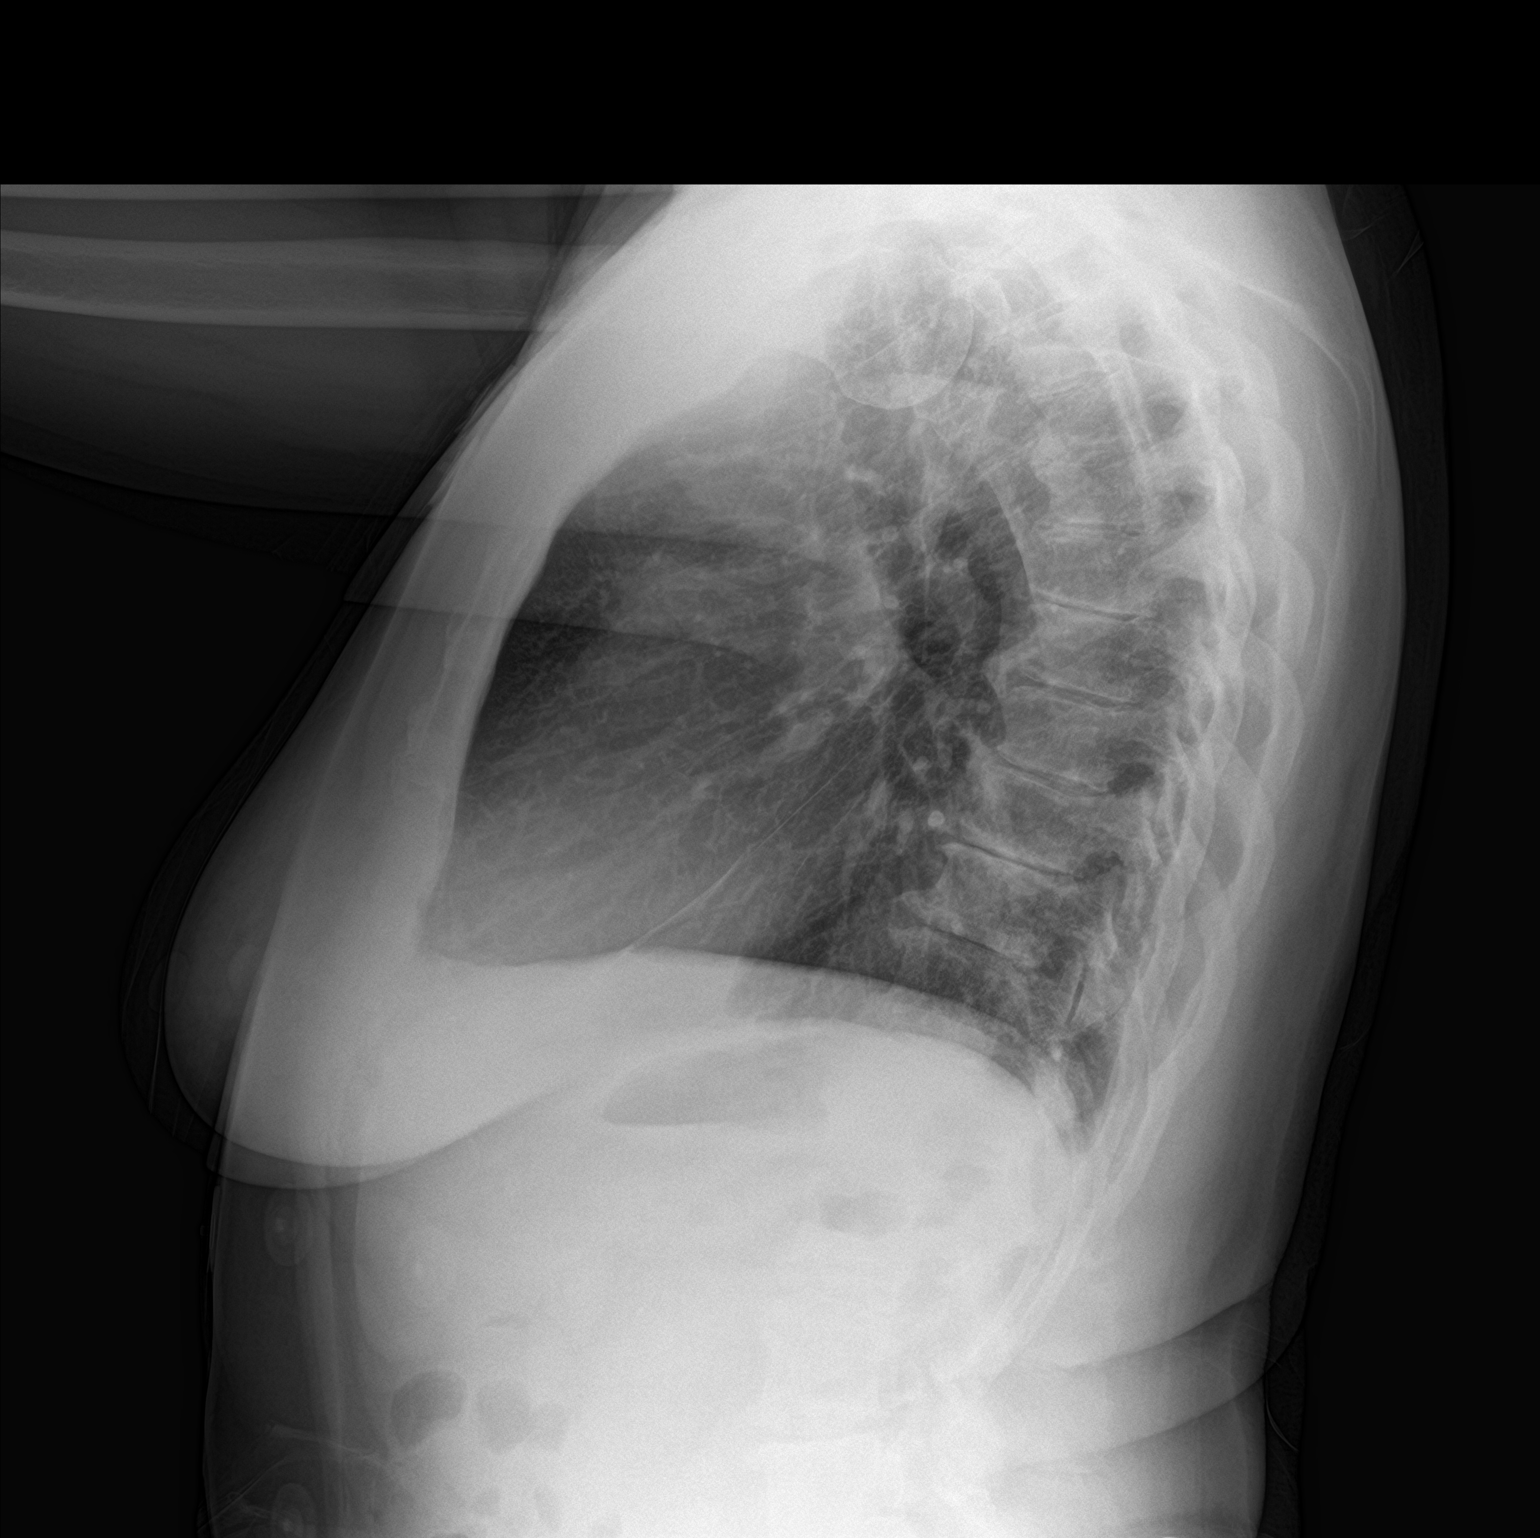

[2 of 2 positions shown; findings below may reference images not displayed]

FINDINGS: Lungs clear. Heart size normal. No pneumothorax or pleural fluid. No
acute or focal bony abnormality. Scoliosis and multilevel
degenerative disease noted.
IMPRESSION: No acute disease.

## 2020-03-24 MED ORDER — SODIUM CHLORIDE 0.9% FLUSH: 3.0000 mL | Freq: Once | INTRAVENOUS | Status: DC

## 2020-03-24 MED ORDER — ONDANSETRON HCL 4 MG/2ML IJ SOLN
4.0000 mg | Freq: Once | INTRAMUSCULAR | Status: AC
  Administered 2020-03-24: 4 mg via INTRAVENOUS
  Filled 2020-03-24: qty 2

## 2020-03-24 MED ORDER — ONDANSETRON 4 MG PO TBDP: 4.0000 mg | ORAL_TABLET | Freq: Three times a day (TID) | ORAL | 0 refills | Status: DC | PRN

## 2020-03-24 MED ORDER — SODIUM CHLORIDE 0.9 % IV BOLUS
1000.0000 mL | Freq: Once | INTRAVENOUS | Status: AC
  Administered 2020-03-24: 1000 mL via INTRAVENOUS

## 2020-03-24 NOTE — ED Provider Notes (Signed)
[facility] EMERGENCY DEPARTMENT Provider Note   CSN: [id number] Arrival date & time: 03/24/20  1341     History Chief Complaint  Patient presents with  . Shortness of Breath  . Diarrhea  . Nausea    Virginia Larson is a 69 y.o. female with past medical history of hypertension, diabetes, presenting to the emergency department with complaint of diarrhea and nausea since this weekend.  She states symptoms started Saturday or Sunday.  She has had persistent nausea with a couple episodes of diarrhea per day.  No vomiting.  She has had intermittent generalized dull abdominal pain.  She is treated her symptoms with Pepto that helped temporarily.  She has not had any fevers.  No known Covid exposures.  She finished her second advisor Covid vaccine in January or February.  No recent travel or antibiotics.  Denies urinary symptoms.  No history of abdominal surgeries or diverticulitis.  She presents today not for severity of symptoms though for persistence.    It is noted that she mention shortness of breath in triage.  She states she has asthma and has been having intermittent shortness of breath attributed to her asthma, no worse than her usual symptoms.  She is not symptomatic currently.  She has not had any chest pain.  The history is provided by the patient.       Past Medical History:  Diagnosis Date  . Diabetes (HCC)   . Glucose intolerance (impaired glucose tolerance)   . Hyperlipidemia   . Hypertension   . Osteoporosis     Patient Active Problem List   Diagnosis Date Noted  . Multinodular goiter (nontoxic), dominant nodules left lobe 08/13/2013  . Essential hypertension, benign 02/07/2013  . 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 Vitamin D deficiency 04/03/2012  . Hyperlipidemia LDL goal < 100 04/03/2012  . Type II diabetes mellitus, uncontrolled (HCC) 04/03/2012  . Obesity (BMI 30.0-34.9) 04/03/2012  . DJD (degenerative joint disease) 04/03/2012     Past Surgical History:  Procedure Laterality Date  . ABDOMINAL HYSTERECTOMY     fibroids.  Ovaries intact.  Marland Kitchen BREAST EXCISIONAL BIOPSY Left over 10 years ago   benign  . BREAST SURGERY     L lump resection; benign.  . CYSTECTOMY     on back     OB History   No obstetric history on file.     Family History  Problem Relation Age of Onset  . Heart disease Mother   . Dementia Mother   . Stroke Mother   . Asthma Sister   . Hypertension Sister   . Colon cancer Neg Hx     Social History   Tobacco Use  . Smoking status: Never Smoker  . Smokeless tobacco: Never Used  Substance Use Topics  . Alcohol use: No  . Drug use: No    Home Medications Prior to Admission medications   Medication Sig Start Date End Date Taking? Authorizing Provider  Biotin 1000 MCG CHEW Chew 1 capsule by mouth daily at 2 PM.    [provider]  hydrochlorothiazide (MICROZIDE) 12.5 MG capsule Take 1 capsule (12.5 mg total) by mouth daily. Office visit needed for refills 06/28/17   Shade Flood, MD  ondansetron (ZOFRAN ODT) 4 MG disintegrating tablet Take 1 tablet (4 mg total) by mouth every 8 (eight) hours as needed for nausea or vomiting. 03/24/20   Lorenzo Arscott, [place] N, PA-C  Turmeric 500 MG CAPS Take 1 capsule by mouth daily at  2 PM.    [provider]    Allergies    Patient has no known allergies.  Review of Systems   Review of Systems  Gastrointestinal: Positive for abdominal pain, diarrhea and nausea.  All other systems reviewed and are negative.   Physical Exam Updated Vital Signs BP 117/70   Pulse 85   Temp 98.5 F (36.9 C) (Oral)   Resp 16   Ht 5\' 7"  (1.702 m)   Wt 70.3 kg   SpO2 98%   BMI 24.28 kg/m   Physical Exam Vitals and nursing note reviewed.  Constitutional:      General: She is not in acute distress.    Appearance: She is well-developed. She is not ill-appearing.  HENT:     Head: Normocephalic and atraumatic.  Eyes:      Conjunctiva/sclera: Conjunctivae normal.  Cardiovascular:     Rate and Rhythm: Normal rate and regular rhythm.  Pulmonary:     Effort: Pulmonary effort is normal. No respiratory distress.     Breath sounds: Normal breath sounds.  Abdominal:     General: Bowel sounds are normal. There is no distension.     Palpations: Abdomen is soft.     Tenderness: There is no abdominal tenderness. There is no guarding or rebound.  Skin:    General: Skin is warm.  Neurological:     Mental Status: She is alert.  Psychiatric:        Behavior: Behavior normal.     ED Results / Procedures / Treatments   Labs (all labs ordered are listed, but only abnormal results are displayed) Labs Reviewed  BASIC METABOLIC PANEL - Abnormal; Notable for the following components:      Result Value   BUN 27 (*)    Creatinine, Ser 1.33 (*)    GFR calc non Af Amer 40 (*)    GFR calc Af Amer 47 (*)    All other components within normal limits  CBC  LIPASE, BLOOD  HEPATIC FUNCTION PANEL  TROPONIN I (HIGH SENSITIVITY)    EKG EKG Interpretation  Date/Time:  Wednesday March 24 2020 13:53:36 EDT Ventricular Rate:  74 PR Interval:  142 QRS Duration: 98 QT Interval:  382 QTC Calculation: 424 R Axis:   4 Text Interpretation: Normal sinus rhythm Incomplete right bundle branch block Borderline ECG Confirmed by [id number] [phone]) on 03/24/2020 8:56:27 PM   Radiology DG Chest 2 View  Result Date: 03/24/2020 CLINICAL DATA:  Shortness of breath and nausea since 03/21/2020. EXAM: CHEST - 2 VIEW COMPARISON:  PA and lateral chest 01/05/2018. FINDINGS: Lungs clear. Heart size normal. No pneumothorax or pleural fluid. No acute or focal bony abnormality. Scoliosis and multilevel degenerative disease noted. IMPRESSION: No acute disease. Electronically Signed   By: 01/07/2018 M.D.   On: 03/24/2020 14:28    Procedures Procedures (including critical care time)  Medications Ordered in ED Medications  sodium  chloride flush (NS) 0.9 % injection 3 mL (3 mLs Intravenous Not Given 03/24/20 2135)  sodium chloride 0.9 % bolus 1,000 mL (0 mLs Intravenous Stopped 03/24/20 2214)  ondansetron (ZOFRAN) injection 4 mg (4 mg Intravenous Given 03/24/20 2134)    ED Course  I have reviewed the triage vital signs and the nursing notes.  Pertinent labs & imaging results that were available during my care of the patient were reviewed by me and considered in my medical decision making (see chart for details).    MDM Rules/Calculators/A&P  Patient presenting with 3 to 4 days of diarrhea, nausea, and intermittent generalized abdominal pain.  She endorses a few episodes of diarrhea per day, however only one episode today.  No recent antibiotics or travel.  No fevers or chills.  No urinary symptoms.  On exam, she is very well-appearing and in no distress.  Abdomen is benign, no tenderness, no guarding or rebound.  Labs with normal white count and hemoglobin.  LFTs and lipase are within normal limits.  Metabolic panel with slightly elevated creatinine, however not much different from prior lab values.  She is given IV fluids and antiemetics.  Tolerating p.o. fluids.  Low suspicion for emergent or surgical intra-abdominal pathology.  Suspect symptoms are secondary to a viral gastroenteritis.  Patient discussed with and evaluated by Dr. Madilyn Hook.  At this time will discharge with symptomatic management.  Patient is safe for discharge.  Discussed results, findings, treatment and follow up. Patient advised of return precautions. Patient verbalized understanding and agreed with plan.  Final Clinical Impression(s) / ED Diagnoses Final diagnoses:  Diarrhea, unspecified type  Nausea    Rx / DC Orders ED Discharge Orders         Ordered    ondansetron (ZOFRAN ODT) 4 MG disintegrating tablet  Every 8 hours PRN     Discontinue  Reprint     03/24/20 2314           Eleaner Dibartolo, [place] N, PA-C 03/24/20  2351    Tilden Fossa, MD 03/26/20 2256

## 2020-03-24 NOTE — ED Triage Notes (Signed)
Nausea started Sunday, diarrhea started Sunday-also shortness of breathe - has not used inhaler, but does have one.  No wheezes audible at present- also c/o neck pain- "It may be stress"

## 2020-03-24 NOTE — Discharge Instructions (Signed)
Please read instructions below. Drink clear liquids until your stomach feels better. Then, slowly introduce bland foods into your diet as tolerated, such as bread, rice, apples, bananas. You can take zofran every 8 hours as needed for nausea. You can take imodium for diarrhea. Follow up with your primary care. Return to the ER for severe abdominal pain, fever, uncontrollable vomiting, or new or concerning symptoms.

## 2020-10-15 ENCOUNTER — Other Ambulatory Visit: Payer: Self-pay

## 2020-10-15 ENCOUNTER — Ambulatory Visit
Admission: RE | Admit: 2020-10-15 | Discharge: 2020-10-15 | Disposition: A | Discharge status: Home / Self Care | Payer: Medicare PPO | Source: Ambulatory Visit | Attending: Family Medicine | Admitting: Family Medicine

## 2020-10-15 DIAGNOSIS — Z1231 Encounter for screening mammogram for malignant neoplasm of breast: Secondary | ICD-10-CM

## 2020-10-15 IMAGING — MG MM DIGITAL SCREENING BILAT W/ TOMO AND CAD
6 of 12 series · 6 of 36 positions shown · non-contrast
Comparison: Previous exam(s).

CLINICAL DATA: Screening.

EXAM:
DIGITAL SCREENING BILATERAL MAMMOGRAM WITH TOMO AND CAD

[L MLO synth-2D]
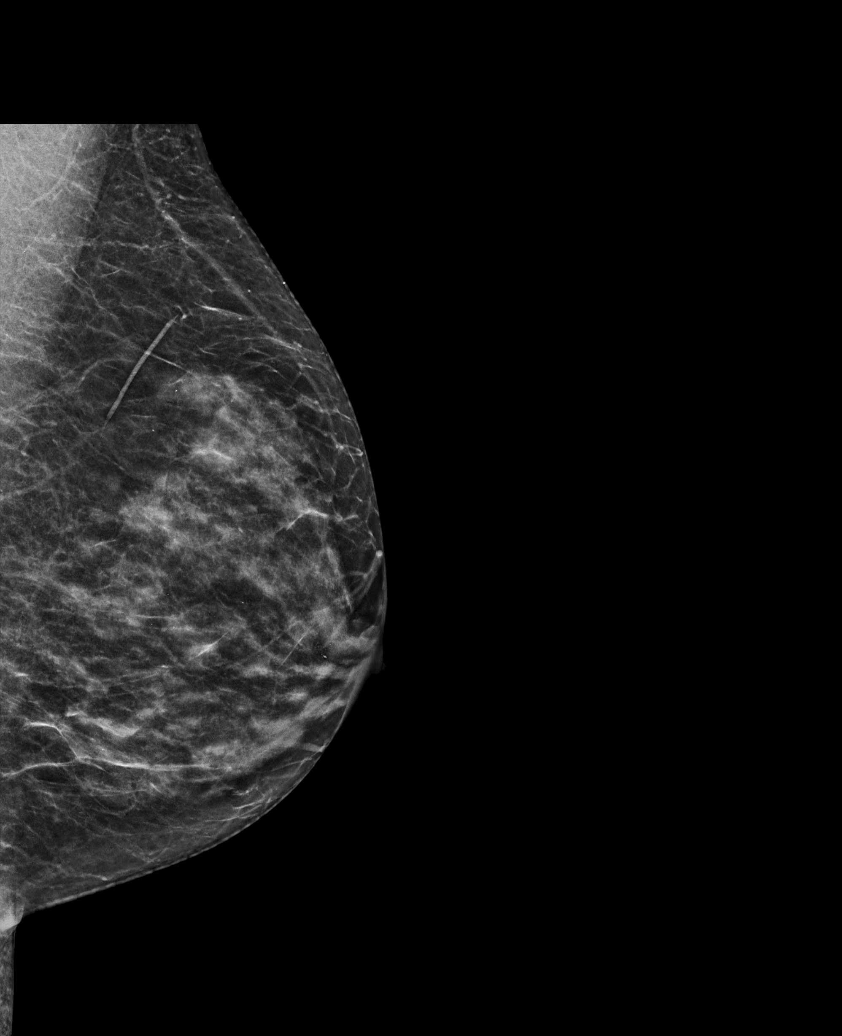

[L CC synth-2D]
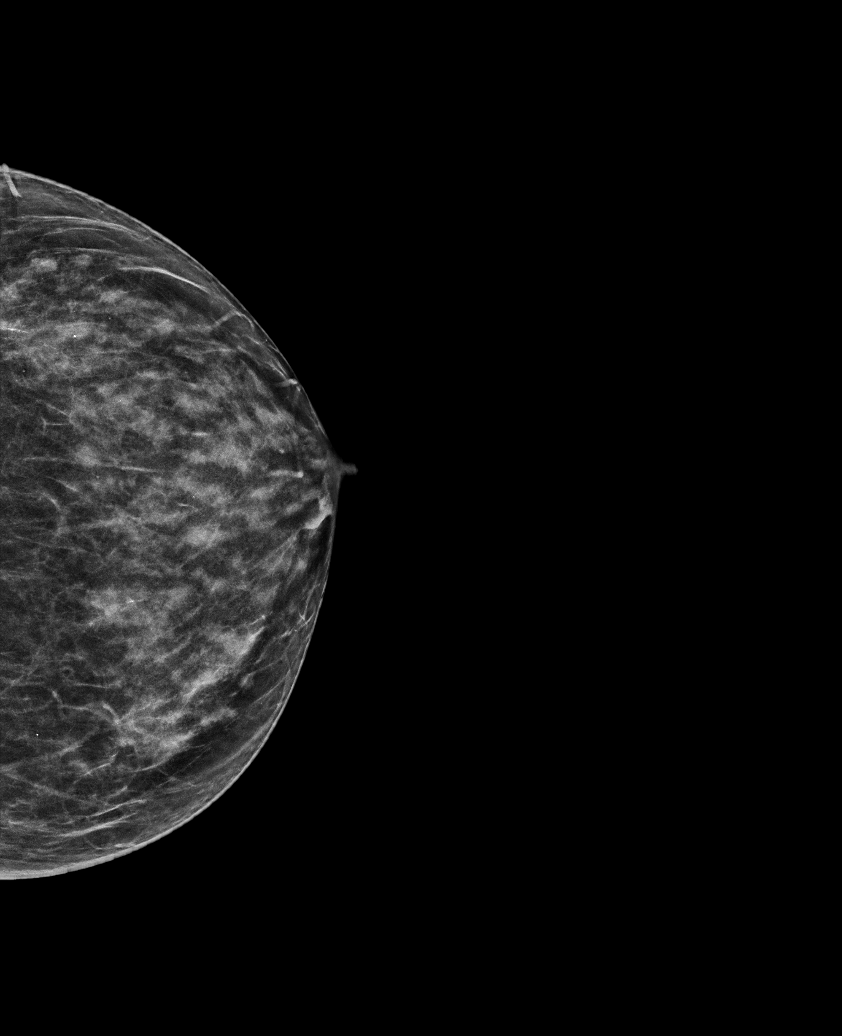

[R XCCL synth-2D]
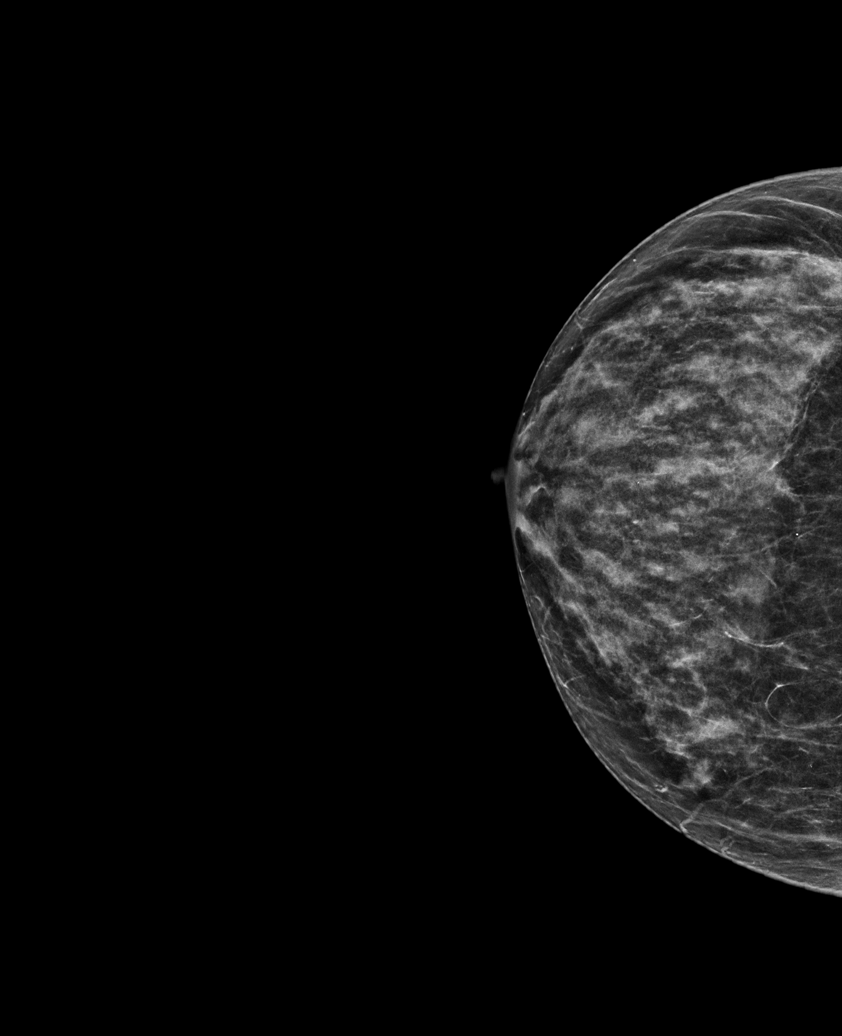

[R MLO synth-2D]
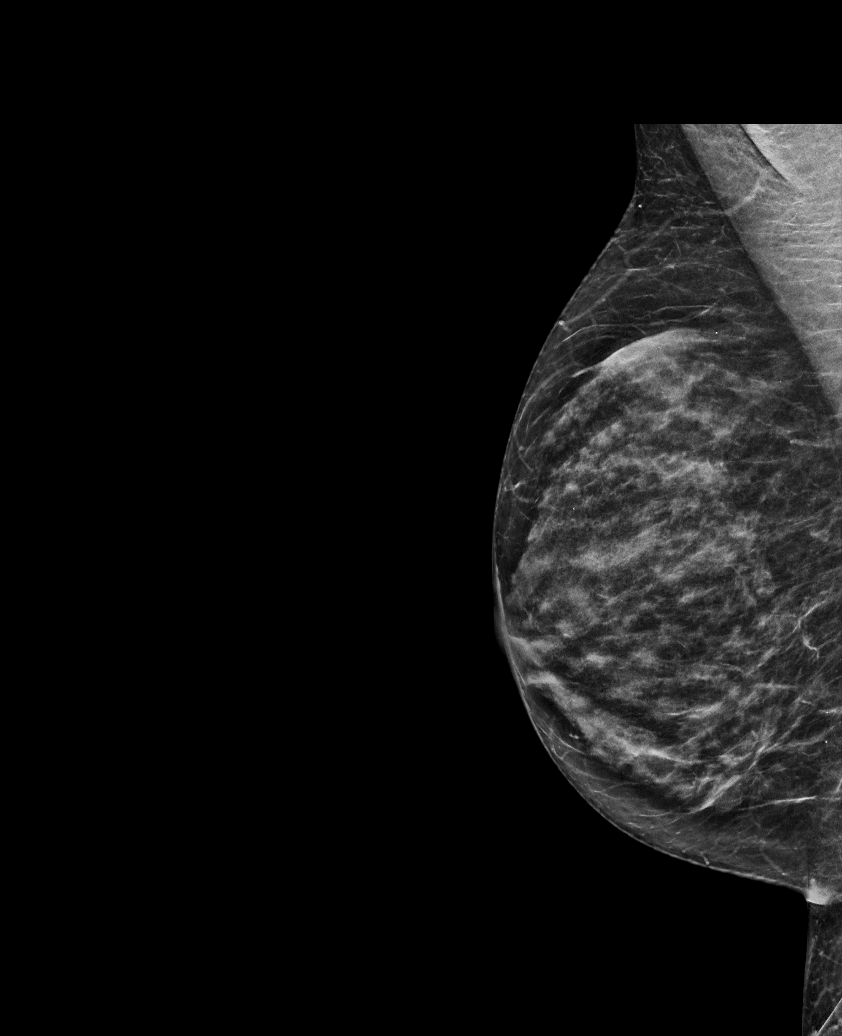

[R CC synth-2D]
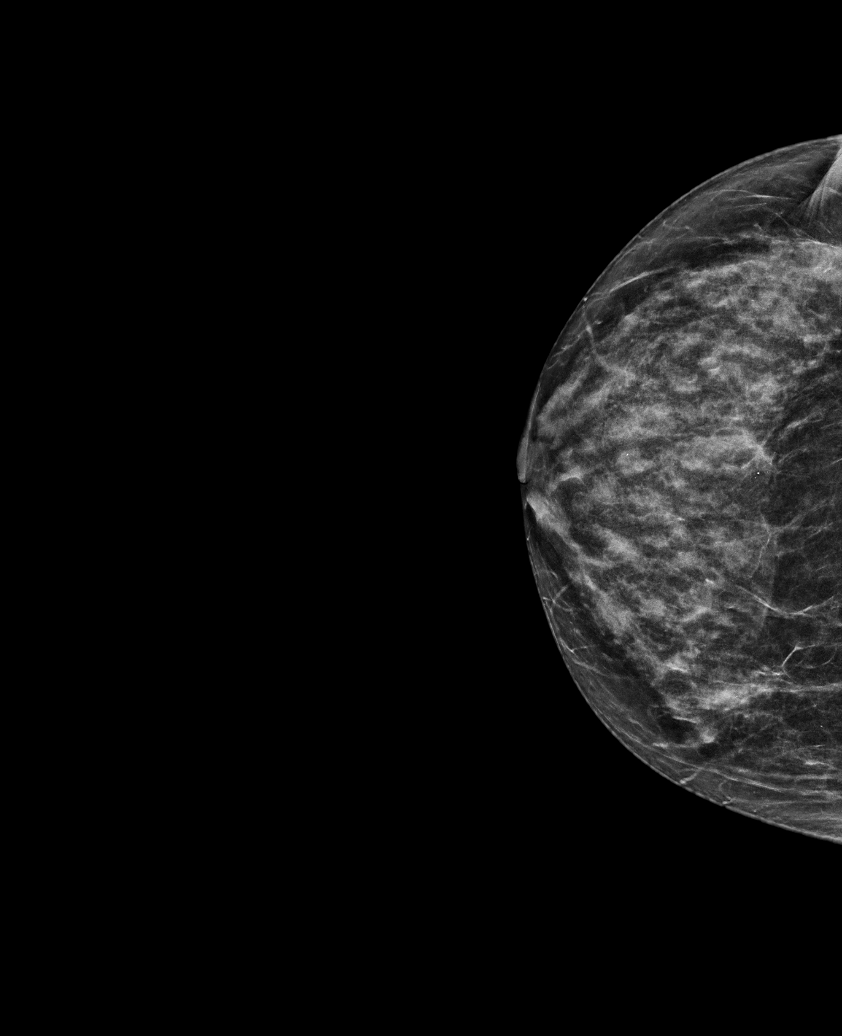

[L XCCL synth-2D]
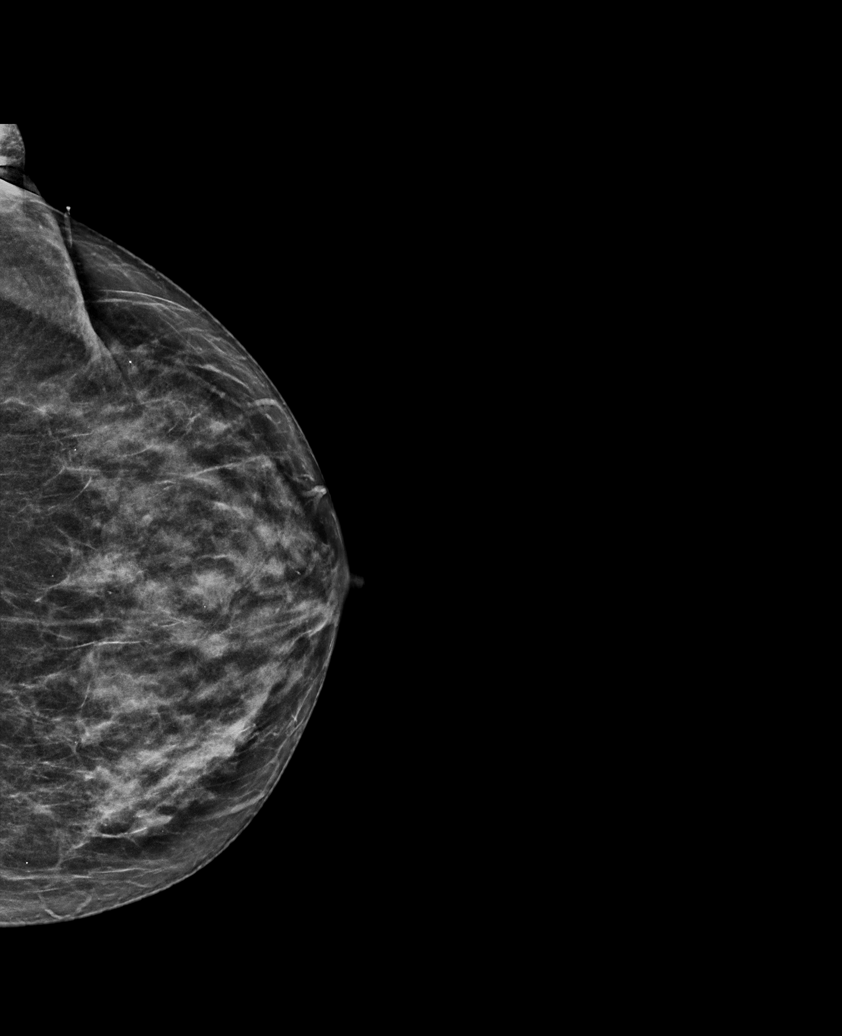

[6 of 36 positions shown; findings below may reference images not displayed]

ACR Breast Density Category c: The breast tissue is heterogeneously
dense, which may obscure small masses.
FINDINGS: There are no findings suspicious for malignancy. The images were
evaluated with computer-aided detection.
IMPRESSION: No mammographic evidence of malignancy. A result letter of this
screening mammogram will be mailed directly to the patient.

RECOMMENDATION:
Screening mammogram in one year. (Code:[A9])

BI-RADS CATEGORY  1: Negative.

## 2022-01-19 ENCOUNTER — Emergency Department (HOSPITAL_COMMUNITY): Payer: Medicare PPO

## 2022-01-19 ENCOUNTER — Emergency Department (HOSPITAL_COMMUNITY)
Admission: EM | Admit: 2022-01-19 | Discharge: 2022-01-20 | Disposition: A | Discharge status: Home / Self Care | Payer: Medicare PPO | Attending: Emergency Medicine | Admitting: Emergency Medicine

## 2022-01-19 ENCOUNTER — Other Ambulatory Visit: Payer: Self-pay

## 2022-01-19 DIAGNOSIS — R059 Cough, unspecified: Secondary | ICD-10-CM | POA: Diagnosis not present

## 2022-01-19 DIAGNOSIS — R600 Localized edema: Secondary | ICD-10-CM | POA: Insufficient documentation

## 2022-01-19 DIAGNOSIS — R0602 Shortness of breath: Secondary | ICD-10-CM | POA: Diagnosis present

## 2022-01-19 DIAGNOSIS — Z20822 Contact with and (suspected) exposure to covid-19: Secondary | ICD-10-CM | POA: Diagnosis not present

## 2022-01-19 DIAGNOSIS — J069 Acute upper respiratory infection, unspecified: Secondary | ICD-10-CM

## 2022-01-19 HISTORY — DX: Essential (primary) hypertension: I10

## 2022-01-19 HISTORY — DX: Gastro-esophageal reflux disease without esophagitis: K21.9

## 2022-01-19 HISTORY — DX: Pure hypercholesterolemia, unspecified: E78.00

## 2022-01-19 MED ORDER — ALBUTEROL SULFATE HFA 108 (90 BASE) MCG/ACT IN AERS
2.0000 | INHALATION_SPRAY | RESPIRATORY_TRACT | Status: DC | PRN
  Filled 2022-01-19: qty 6.7

## 2022-01-19 NOTE — ED Triage Notes (Signed)
Pt here from home for shob that started 1.5 hours ago suddenly. Pt's husband reports she has had a cold recently. Pt denies chest pain, denies pain with inspiration, reports dyspnea is worse w/ exertion. Pt states her ankles felt swollen yesterday, but denies that today. Hx HTN, DM type 2. ?

## 2022-01-20 ENCOUNTER — Encounter (HOSPITAL_COMMUNITY): Payer: Self-pay

## 2022-01-20 LAB — RESP PANEL BY RT-PCR (FLU A&B, COVID) ARPGX2
Influenza A by PCR: NEGATIVE
Influenza B by PCR: NEGATIVE
SARS Coronavirus 2 by RT PCR: NEGATIVE

## 2022-01-20 LAB — CBC WITH DIFFERENTIAL/PLATELET
Abs Immature Granulocytes: 0 10*3/uL (ref 0.00–0.07)
Basophils Absolute: 0.1 10*3/uL (ref 0.0–0.1)
Basophils Relative: 1 %
Eosinophils Absolute: 0.4 10*3/uL (ref 0.0–0.5)
Eosinophils Relative: 3 %
HCT: 38.6 % (ref 36.0–46.0)
Hemoglobin: 12.8 g/dL (ref 12.0–15.0)
Lymphocytes Relative: 47 %
Lymphs Abs: 6.2 10*3/uL — ABNORMAL HIGH (ref 0.7–4.0)
MCH: 31.2 pg (ref 26.0–34.0)
MCHC: 33.2 g/dL (ref 30.0–36.0)
MCV: 94.1 fL (ref 80.0–100.0)
Monocytes Absolute: 0.9 10*3/uL (ref 0.1–1.0)
Monocytes Relative: 7 %
Neutro Abs: 5.5 10*3/uL (ref 1.7–7.7)
Neutrophils Relative %: 42 %
Platelets: 221 10*3/uL (ref 150–400)
RBC: 4.1 MIL/uL (ref 3.87–5.11)
RDW: 13.3 % (ref 11.5–15.5)
WBC: 13.1 10*3/uL — ABNORMAL HIGH (ref 4.0–10.5)
nRBC: 0 % (ref 0.0–0.2)
nRBC: 0 /100 WBC

## 2022-01-20 LAB — BASIC METABOLIC PANEL
Anion gap: 5 (ref 5–15)
BUN: 19 mg/dL (ref 8–23)
CO2: 23 mmol/L (ref 22–32)
Calcium: 9 mg/dL (ref 8.9–10.3)
Chloride: 110 mmol/L (ref 98–111)
Creatinine, Ser: 1.36 mg/dL — ABNORMAL HIGH (ref 0.44–1.00)
GFR, Estimated: 41 mL/min — ABNORMAL LOW (ref >60)
Glucose, Bld: 204 mg/dL — ABNORMAL HIGH (ref 70–99)
Potassium: 4.3 mmol/L (ref 3.5–5.1)
Sodium: 138 mmol/L (ref 135–145)

## 2022-01-20 LAB — TROPONIN I (HIGH SENSITIVITY)
Troponin I (High Sensitivity): 4 ng/L (ref <18)
Troponin I (High Sensitivity): 4 ng/L (ref <18)

## 2022-01-20 LAB — BRAIN NATRIURETIC PEPTIDE: B Natriuretic Peptide: 13.8 pg/mL (ref 0.0–100.0)

## 2022-01-20 MED ORDER — ALBUTEROL SULFATE HFA 108 (90 BASE) MCG/ACT IN AERS: 2.0000 | INHALATION_SPRAY | RESPIRATORY_TRACT | 0 refills | Status: AC | PRN

## 2022-01-20 MED ORDER — AZITHROMYCIN 250 MG PO TABS: 500.0000 mg | ORAL_TABLET | Freq: Once | ORAL | Status: DC

## 2022-01-20 MED ORDER — ALBUTEROL SULFATE (2.5 MG/3ML) 0.083% IN NEBU
5.0000 mg | INHALATION_SOLUTION | Freq: Once | RESPIRATORY_TRACT | Status: AC
  Administered 2022-01-20: 5 mg via RESPIRATORY_TRACT
  Filled 2022-01-20: qty 6

## 2022-01-20 NOTE — ED Provider Notes (Signed)
?[facility] EMERGENCY DEPARTMENT ?Provider Note ? ? ?CSN: [id number] ?Arrival date & time: 01/19/22  2238 ? ?  ? ?History ? ?Chief Complaint  ?Patient presents with  ? Shortness of Breath  ? ? ?Virginia Larson is a 71 y.o. female. ? ?Patient with uri symptoms for past week or so, and tonight, at rest, noted feeling sob. Patient indicates symptoms began with upper resp congestion and non prod cough, nasal congestion 1-1.5 weeks ago. Cough has persisted. Denies any chest pain or discomfort. No pleuritic pain. No fever/chills. Pt has been taking otc cough/cold medication. Indicates compliant w her other meds. Mild bil foot/ankle/lower leg edema - pt notes similar symptoms in past. No pnd. Urinating normally. No abd pain or nvd. No specific known ill contacts or known covid exposure.  ? ?The history is provided by the patient, the spouse and medical records.  ?Shortness of Breath ?Associated symptoms: cough   ?Associated symptoms: no abdominal pain, no chest pain, no fever, no headaches, no neck pain, no rash and no vomiting   ? ?  ? ?Home Medications ?Prior to Admission medications   ?Medication Sig Start Date End Date Taking? Authorizing Provider  ?Biotin 1000 MCG CHEW Chew 1 capsule by mouth daily at 2 PM.    [provider]  ?hydrochlorothiazide (MICROZIDE) 12.5 MG capsule Take 1 capsule (12.5 mg total) by mouth daily. Office visit needed for refills 06/28/17   Shade Flood, MD  ?ondansetron (ZOFRAN ODT) 4 MG disintegrating tablet Take 1 tablet (4 mg total) by mouth every 8 (eight) hours as needed for nausea or vomiting. 03/24/20   Robinson, [place] N, PA-C  ?Turmeric 500 MG CAPS Take 1 capsule by mouth daily at 2 PM.    [provider]  ?   ? ?Allergies    ?Patient has no known allergies.   ? ?Review of Systems   ?Review of Systems  ?Constitutional:  Negative for chills and fever.  ?HENT:  Positive for congestion and rhinorrhea.   ?Eyes:  Negative for redness.  ?Respiratory:   Positive for cough and shortness of breath.   ?Cardiovascular:  Negative for chest pain and palpitations.  ?Gastrointestinal:  Negative for abdominal pain, diarrhea and vomiting.  ?Genitourinary:  Negative for flank pain.  ?Musculoskeletal:  Negative for back pain and neck pain.  ?Skin:  Negative for rash.  ?Neurological:  Negative for headaches.  ?Hematological:  Does not bruise/bleed easily.  ?Psychiatric/Behavioral:  Negative for confusion.   ? ?Physical Exam ?Updated Vital Signs ?BP 124/74 (BP Location: Right Arm)   Pulse 76   Temp 98 ?F (36.7 ?C) (Oral)   Resp 18   SpO2 100%  ?Physical Exam ?Vitals and nursing note reviewed.  ?Constitutional:   ?   Appearance: Normal appearance. She is well-developed.  ?   Comments: +non prod cough.  ?HENT:  ?   Head: Atraumatic.  ?   Nose: Nose normal.  ?   Mouth/Throat:  ?   Mouth: Mucous membranes are moist.  ?Eyes:  ?   General: No scleral icterus. ?   Conjunctiva/sclera: Conjunctivae normal.  ?Neck:  ?   Trachea: No tracheal deviation.  ?Cardiovascular:  ?   Rate and Rhythm: Normal rate and regular rhythm.  ?   Pulses: Normal pulses.  ?   Heart sounds: Normal heart sounds. No murmur heard. ?  No friction rub. No gallop.  ?Pulmonary:  ?   Effort: Pulmonary effort is normal. No respiratory distress.  ?  Breath sounds: Normal breath sounds.  ?   Comments: Coughing. ?sl wheeze.  ?Abdominal:  ?   General: Bowel sounds are normal. There is no distension.  ?   Palpations: Abdomen is soft.  ?   Tenderness: There is no abdominal tenderness.  ?Genitourinary: ?   Comments: No cva tenderness.  ?Musculoskeletal:  ?   Cervical back: Normal range of motion and neck supple. No rigidity. No muscular tenderness.  ?   Comments: Very mild symmetric bilateral ankle edema. No calf pain/swelling.  ?Skin: ?   General: Skin is warm and dry.  ?   Findings: No rash.  ?Neurological:  ?   Mental Status: She is alert.  ?   Comments: Alert, speech normal.   ?Psychiatric:     ?   Mood and Affect:  Mood normal.  ? ? ?ED Results / Procedures / Treatments   ?Labs ?(all labs ordered are listed, but only abnormal results are displayed) ?Results for orders placed or performed during the hospital encounter of 01/19/22  ?CBC with Differential  ?Result Value Ref Range  ? WBC 13.1 (H) 4.0 - 10.5 K/uL  ? RBC 4.10 3.87 - 5.11 MIL/uL  ? Hemoglobin 12.8 12.0 - 15.0 g/dL  ? HCT 38.6 36.0 - 46.0 %  ? MCV 94.1 80.0 - 100.0 fL  ? MCH 31.2 26.0 - 34.0 pg  ? MCHC 33.2 30.0 - 36.0 g/dL  ? RDW 13.3 11.5 - 15.5 %  ? Platelets 221 150 - 400 K/uL  ? nRBC 0.0 0.0 - 0.2 %  ? Neutrophils Relative % 42 %  ? Neutro Abs 5.5 1.7 - 7.7 K/uL  ? Lymphocytes Relative 47 %  ? Lymphs Abs 6.2 (H) 0.7 - 4.0 K/uL  ? Monocytes Relative 7 %  ? Monocytes Absolute 0.9 0.1 - 1.0 K/uL  ? Eosinophils Relative 3 %  ? Eosinophils Absolute 0.4 0.0 - 0.5 K/uL  ? Basophils Relative 1 %  ? Basophils Absolute 0.1 0.0 - 0.1 K/uL  ? nRBC 0 0 /100 WBC  ? Abs Immature Granulocytes 0.00 0.00 - 0.07 K/uL  ?Basic metabolic panel  ?Result Value Ref Range  ? Sodium 138 135 - 145 mmol/L  ? Potassium 4.3 3.5 - 5.1 mmol/L  ? Chloride 110 98 - 111 mmol/L  ? CO2 23 22 - 32 mmol/L  ? Glucose, Bld 204 (H) 70 - 99 mg/dL  ? BUN 19 8 - 23 mg/dL  ? Creatinine, Ser 1.36 (H) 0.44 - 1.00 mg/dL  ? Calcium 9.0 8.9 - 10.3 mg/dL  ? GFR, Estimated 41 (L) >60 mL/min  ? Anion gap 5 5 - 15  ?Brain natriuretic peptide  ?Result Value Ref Range  ? B Natriuretic Peptide 13.8 0.0 - 100.0 pg/mL  ?Troponin I (High Sensitivity)  ?Result Value Ref Range  ? Troponin I (High Sensitivity) 4 <18 ng/L  ?Troponin I (High Sensitivity)  ?Result Value Ref Range  ? Troponin I (High Sensitivity) 4 <18 ng/L  ? ?DG Chest 2 View ? ?Result Date: 01/19/2022 ?CLINICAL DATA:  Dyspnea EXAM: CHEST - 2 VIEW COMPARISON:  03/24/2020 FINDINGS: The lungs are symmetrically well expanded. No pneumothorax or pleural effusion. There is diffuse interstitial thickening which may relate to airway inflammation or mild interstitial  pulmonary edema. Cardiac size is within normal limits. Mild thoracolumbar levoscoliosis again noted. No acute bone abnormality. IMPRESSION: Interval development of mild interstitial thickening, possibly reflecting airway inflammation or trace interstitial pulmonary edema in the acute setting. Electronically Signed  By: Helyn NumbersAshesh  Parikh M.D.   On: 01/19/2022 23:30   ? ? ? ?EKG ?EKG Interpretation ? ?Date/Time:  Thursday January 19 2022 22:43:09 EDT ?Ventricular Rate:  76 ?PR Interval:  136 ?QRS Duration: 96 ?QT Interval:  390 ?QTC Calculation: 438 ?R Axis:   -9 ?Text Interpretation: Normal sinus rhythm No significant change since last tracing Confirmed by Cathren LaineSteinl, Conchita Truxillo ([id number]) on 01/20/2022 12:45:28 AM ? ?Radiology ?DG Chest 2 View ? ?Result Date: 01/19/2022 ?CLINICAL DATA:  Dyspnea EXAM: CHEST - 2 VIEW COMPARISON:  03/24/2020 FINDINGS: The lungs are symmetrically well expanded. No pneumothorax or pleural effusion. There is diffuse interstitial thickening which may relate to airway inflammation or mild interstitial pulmonary edema. Cardiac size is within normal limits. Mild thoracolumbar levoscoliosis again noted. No acute bone abnormality. IMPRESSION: Interval development of mild interstitial thickening, possibly reflecting airway inflammation or trace interstitial pulmonary edema in the acute setting. Electronically Signed   By: Helyn NumbersAshesh  Parikh M.D.   On: 01/19/2022 23:30   ? ?Procedures ?Procedures  ? ? ?Medications Ordered in ED ?Medications  ?albuterol (VENTOLIN HFA) 108 (90 Base) MCG/ACT inhaler 2 puff (has no administration in time range)  ? ? ?ED Course/ Medical Decision Making/ A&P ?  ?                        ?Medical Decision Making ?Problems Addressed: ?URI with cough and congestion: acute illness or injury with systemic symptoms ? ?Amount and/or Complexity of Data Reviewed ?Independent Historian: spouse ?   Details: hx ?Labs: ordered. Decision-making details documented in ED Course. ?Radiology: ordered and  independent interpretation performed. Decision-making details documented in ED Course. ? ?Risk ?Prescription drug management. ? ? ?Iv ns. Continuous pulse ox and cardiac monitoring. Labs ordered/sent. Imaging ord

## 2022-01-20 NOTE — Discharge Instructions (Addendum)
It was our pleasure to provide your ER care today - we hope that you feel better. ? ?Drink plenty of fluids/stay well hydrated. Use albuterol inhaler as need. ? ?Your covid/flu test is pending and should be back in about an hour - you may check MyChart or call for results.  ? ?Follow up with primary care doctor in one week if symptoms fail to improve/resolve. ? ?Return to ER if worse, new symptoms, high fevers, increased trouble breathing, chest pain, or other concern.  ?

## 2022-01-20 NOTE — ED Notes (Signed)
Provider at bedside

## 2022-01-20 NOTE — ED Notes (Signed)
Pt asking what is she waiting on . Spoke with the provider and was advised that we were waiting on the covid and flu test results and that if the pt wanted to go home and just check mychart or call about the results she could and go home. Spoke with pt and pt stated that she would just call for the test results and go home. Provider made aware of same at this time ?

## 2022-01-23 ENCOUNTER — Other Ambulatory Visit: Payer: Self-pay

## 2022-01-23 ENCOUNTER — Emergency Department (HOSPITAL_COMMUNITY)
Admission: EM | Admit: 2022-01-23 | Discharge: 2022-01-23 | Disposition: A | Discharge status: Home / Self Care | Payer: Medicare PPO | Attending: Emergency Medicine | Admitting: Emergency Medicine

## 2022-01-23 ENCOUNTER — Emergency Department (HOSPITAL_COMMUNITY): Payer: Medicare PPO

## 2022-01-23 ENCOUNTER — Encounter (HOSPITAL_COMMUNITY): Payer: Self-pay | Admitting: Emergency Medicine

## 2022-01-23 DIAGNOSIS — M5090 Cervical disc disorder, unspecified, unspecified cervical region: Secondary | ICD-10-CM | POA: Diagnosis not present

## 2022-01-23 DIAGNOSIS — I639 Cerebral infarction, unspecified: Secondary | ICD-10-CM | POA: Diagnosis not present

## 2022-01-23 DIAGNOSIS — R209 Unspecified disturbances of skin sensation: Secondary | ICD-10-CM | POA: Insufficient documentation

## 2022-01-23 DIAGNOSIS — R7989 Other specified abnormal findings of blood chemistry: Secondary | ICD-10-CM | POA: Diagnosis not present

## 2022-01-23 DIAGNOSIS — R200 Anesthesia of skin: Secondary | ICD-10-CM

## 2022-01-23 DIAGNOSIS — Z79899 Other long term (current) drug therapy: Secondary | ICD-10-CM | POA: Diagnosis not present

## 2022-01-23 LAB — CBC WITH DIFFERENTIAL/PLATELET
Abs Immature Granulocytes: 0.04 10*3/uL (ref 0.00–0.07)
Basophils Absolute: 0 10*3/uL (ref 0.0–0.1)
Basophils Relative: 0 %
Eosinophils Absolute: 0.4 10*3/uL (ref 0.0–0.5)
Eosinophils Relative: 4 %
HCT: 37.6 % (ref 36.0–46.0)
Hemoglobin: 12.4 g/dL (ref 12.0–15.0)
Immature Granulocytes: 0 %
Lymphocytes Relative: 43 %
Lymphs Abs: 4.2 10*3/uL — ABNORMAL HIGH (ref 0.7–4.0)
MCH: 31.3 pg (ref 26.0–34.0)
MCHC: 33 g/dL (ref 30.0–36.0)
MCV: 94.9 fL (ref 80.0–100.0)
Monocytes Absolute: 0.7 10*3/uL (ref 0.1–1.0)
Monocytes Relative: 7 %
Neutro Abs: 4.4 10*3/uL (ref 1.7–7.7)
Neutrophils Relative %: 46 %
Platelets: 192 10*3/uL (ref 150–400)
RBC: 3.96 MIL/uL (ref 3.87–5.11)
RDW: 13.3 % (ref 11.5–15.5)
WBC: 9.8 10*3/uL (ref 4.0–10.5)
nRBC: 0 % (ref 0.0–0.2)

## 2022-01-23 LAB — I-STAT CHEM 8, ED
BUN: 23 mg/dL (ref 8–23)
Calcium, Ion: 1.21 mmol/L (ref 1.15–1.40)
Chloride: 109 mmol/L (ref 98–111)
Creatinine, Ser: 1.2 mg/dL — ABNORMAL HIGH (ref 0.44–1.00)
Glucose, Bld: 155 mg/dL — ABNORMAL HIGH (ref 70–99)
HCT: 37 % (ref 36.0–46.0)
Hemoglobin: 12.6 g/dL (ref 12.0–15.0)
Potassium: 4.3 mmol/L (ref 3.5–5.1)
Sodium: 143 mmol/L (ref 135–145)
TCO2: 25 mmol/L (ref 22–32)

## 2022-01-23 LAB — COMPREHENSIVE METABOLIC PANEL
ALT: 17 U/L (ref 0–44)
AST: 18 U/L (ref 15–41)
Albumin: 3.5 g/dL (ref 3.5–5.0)
Alkaline Phosphatase: 134 U/L — ABNORMAL HIGH (ref 38–126)
Anion gap: 8 (ref 5–15)
BUN: 19 mg/dL (ref 8–23)
CO2: 24 mmol/L (ref 22–32)
Calcium: 9 mg/dL (ref 8.9–10.3)
Chloride: 109 mmol/L (ref 98–111)
Creatinine, Ser: 1.24 mg/dL — ABNORMAL HIGH (ref 0.44–1.00)
GFR, Estimated: 46 mL/min — ABNORMAL LOW (ref >60)
Glucose, Bld: 151 mg/dL — ABNORMAL HIGH (ref 70–99)
Potassium: 4.2 mmol/L (ref 3.5–5.1)
Sodium: 141 mmol/L (ref 135–145)
Total Bilirubin: 0.5 mg/dL (ref 0.3–1.2)
Total Protein: 6.4 g/dL — ABNORMAL LOW (ref 6.5–8.1)

## 2022-01-23 LAB — URINALYSIS, ROUTINE W REFLEX MICROSCOPIC
Bilirubin Urine: NEGATIVE
Glucose, UA: NEGATIVE mg/dL
Hgb urine dipstick: NEGATIVE
Ketones, ur: NEGATIVE mg/dL
Nitrite: NEGATIVE
Protein, ur: NEGATIVE mg/dL
Specific Gravity, Urine: 1.02 (ref 1.005–1.030)
pH: 5 (ref 5.0–8.0)

## 2022-01-23 LAB — RAPID URINE DRUG SCREEN, HOSP PERFORMED
Amphetamines: NOT DETECTED
Barbiturates: NOT DETECTED
Benzodiazepines: NOT DETECTED
Cocaine: NOT DETECTED
Opiates: NOT DETECTED
Tetrahydrocannabinol: NOT DETECTED

## 2022-01-23 LAB — CK: Total CK: 105 U/L (ref 38–234)

## 2022-01-23 LAB — MAGNESIUM: Magnesium: 2.2 mg/dL (ref 1.7–2.4)

## 2022-01-23 LAB — ETHANOL: Alcohol, Ethyl (B): <10 mg/dL (ref <10)

## 2022-01-23 LAB — PROTIME-INR
INR: 1 (ref 0.8–1.2)
Prothrombin Time: 12.8 seconds (ref 11.4–15.2)

## 2022-01-23 LAB — APTT: aPTT: 29 seconds (ref 24–36)

## 2022-01-23 IMAGING — MR MR CERVICAL SPINE W/O CM
4 of 5 series · 18 of 48 positions shown · non-contrast
Comparison: Head CT from earlier today

CLINICAL DATA: Right cervical radiculopathy with 5 days of
persistent left-sided numbness and tingling from neck to leg.

EXAM:
MRI HEAD WITHOUT CONTRAST
MRI CERVICAL SPINE WITHOUT CONTRAST
TECHNIQUE: Multiplanar, multiecho pulse sequences of the brain and surrounding
structures, and cervical spine, to include the craniocervical
junction and cervicothoracic junction, were obtained without
intravenous contrast.

[Series 2: STIR · sagittal · 3.0mm · 0.39mm/px · 3 of 16 slices shown]
[im 1/16]
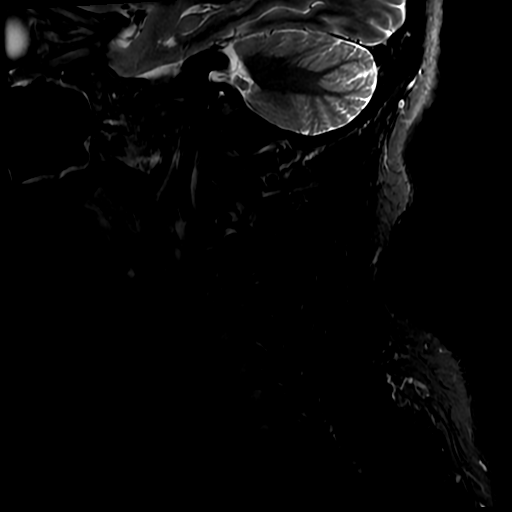
[im 11/16]
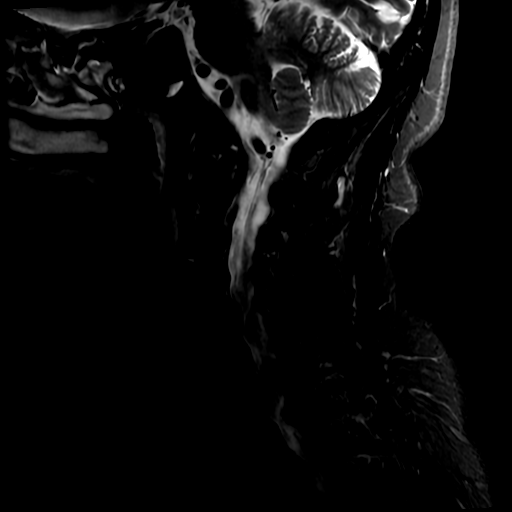
[im 16/16]
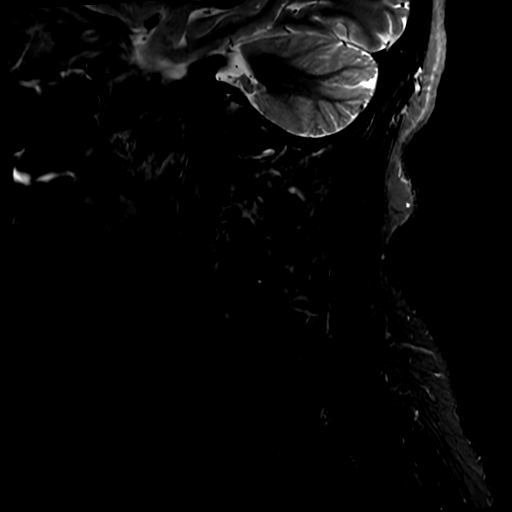

[Series 3: T2 · sagittal · 3.0mm · 0.35mm/px · 5 of 16 slices shown (1 of 2)]
[im 1/16]
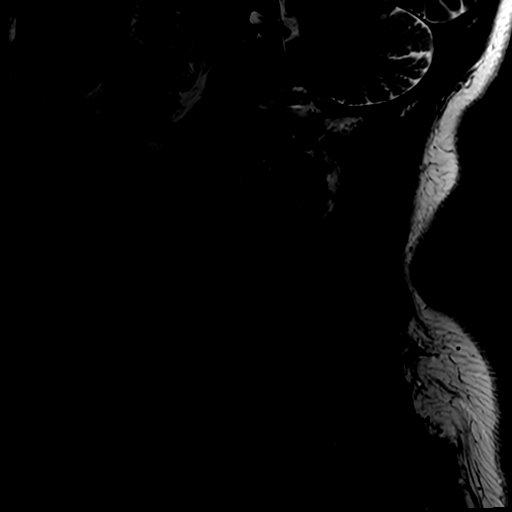
[im 4/16]
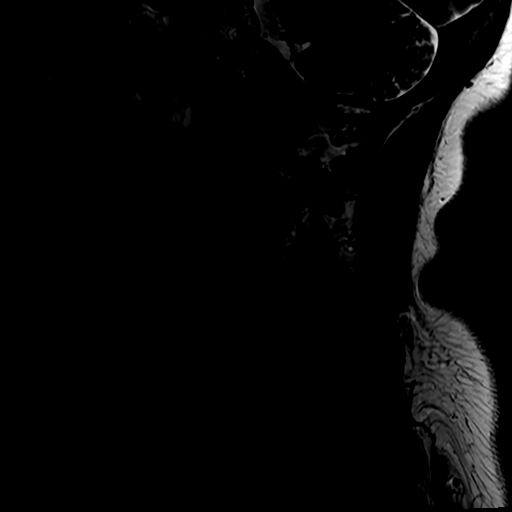
[im 8/16]
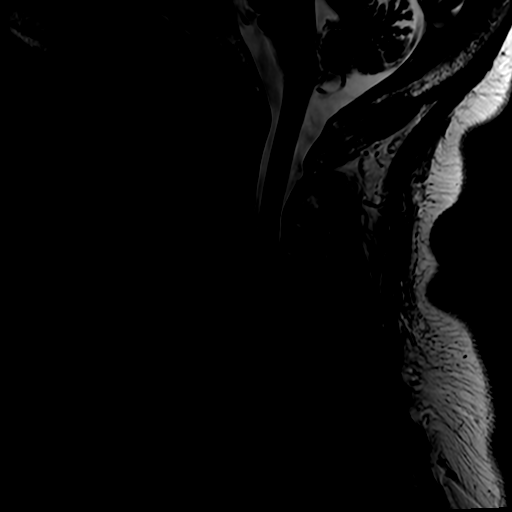
[im 12/16]
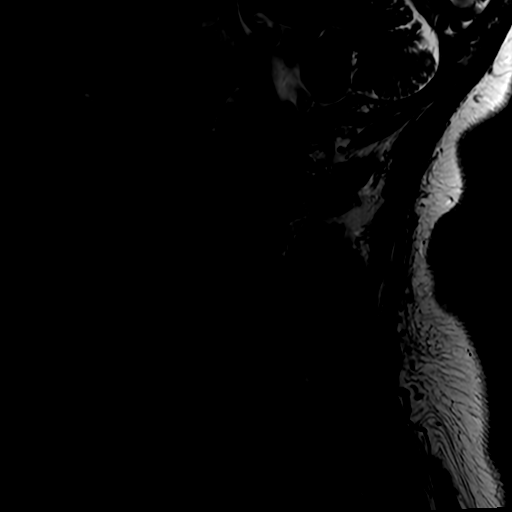
[im 16/16]
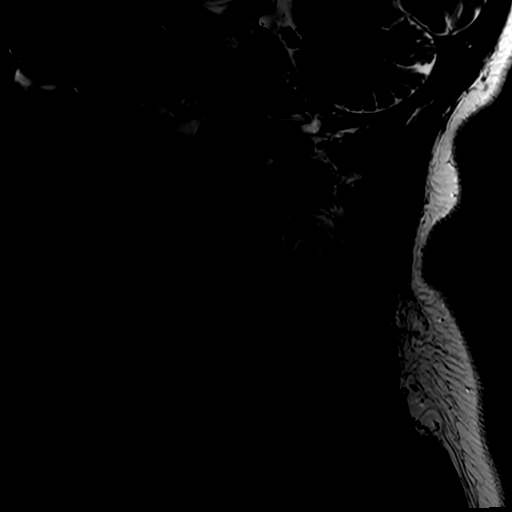

[Series 4: FLAIR · sagittal · 3.0mm · 0.35mm/px · 3 of 16 slices shown]
[im 1/16]
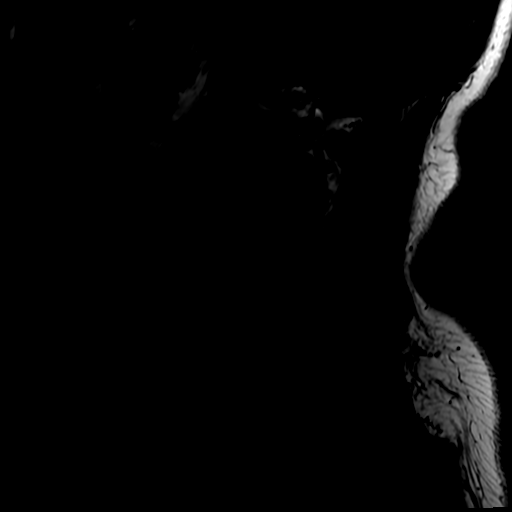
[im 8/16]
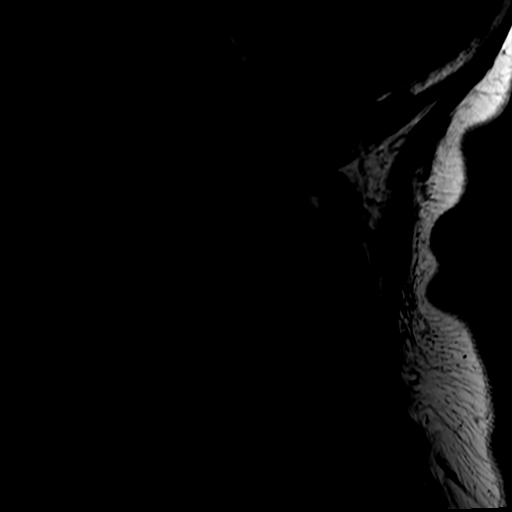
[im 16/16]
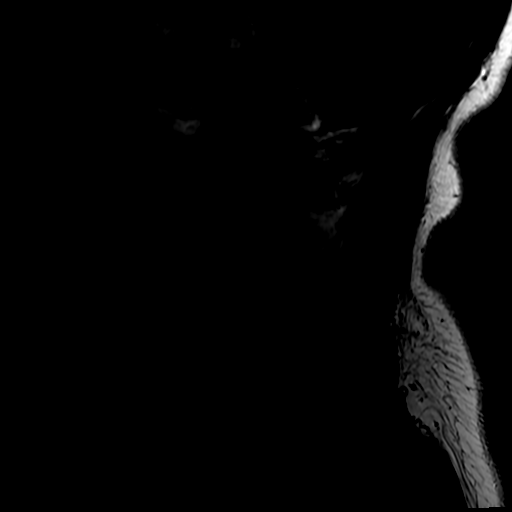

[Series 7: T2 · axial · 3.0mm · 0.35mm/px · z∈[-236,-157]mm · 7 of 32 slices shown (2 of 2)]
[im 1/32]
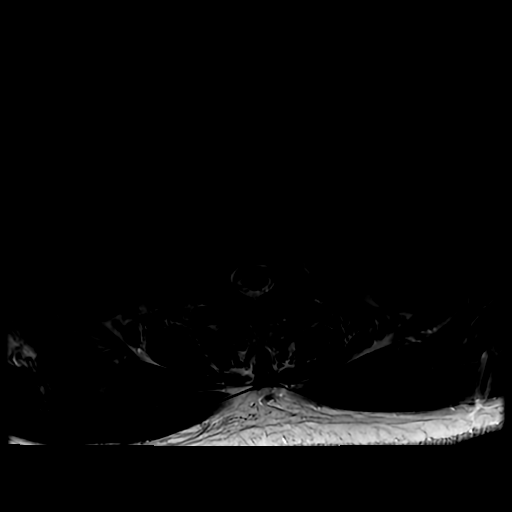
[im 4/32]
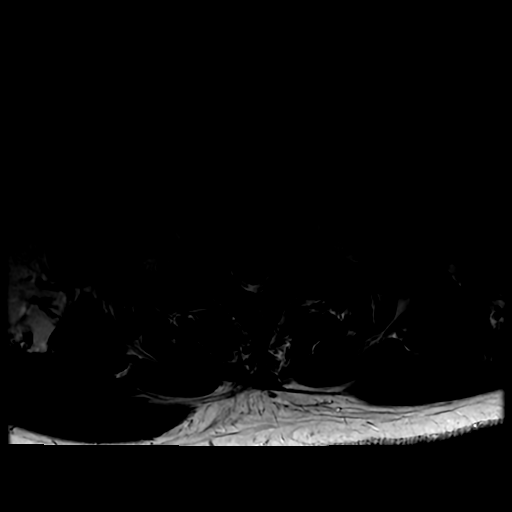
[im 8/32]
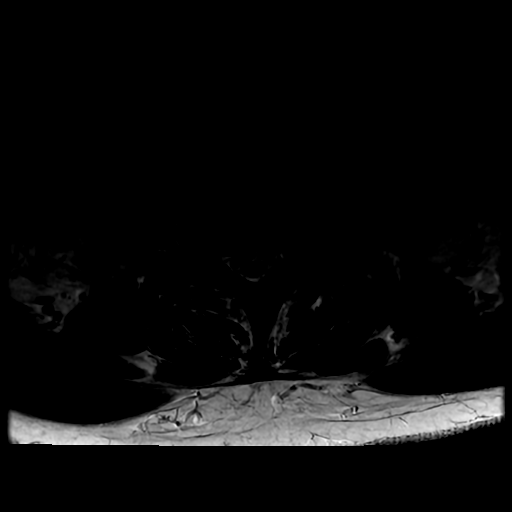
[im 12/32]
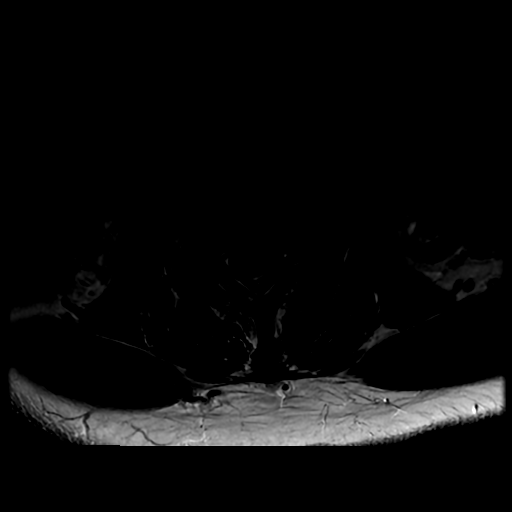
[im 16/32]
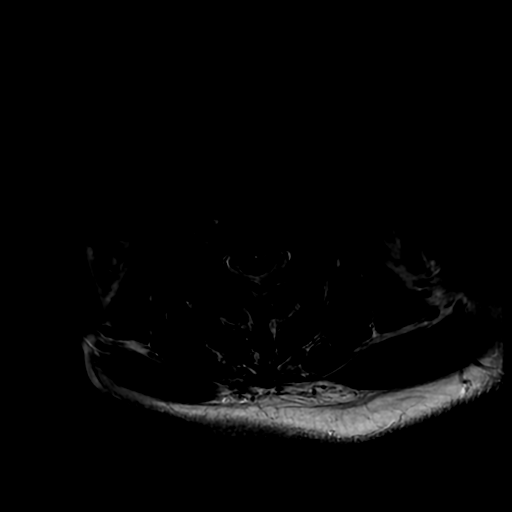
[im 20/32]
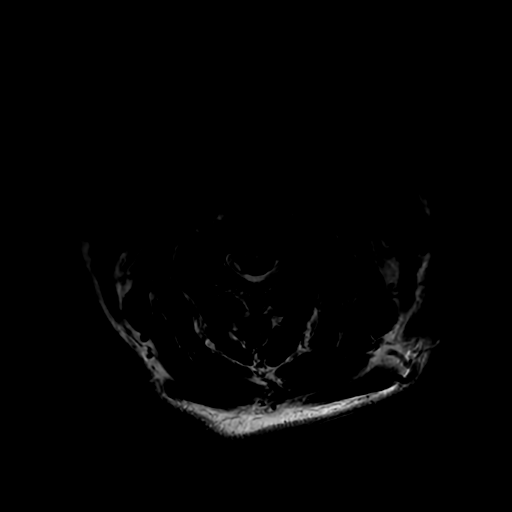
[im 28/32]
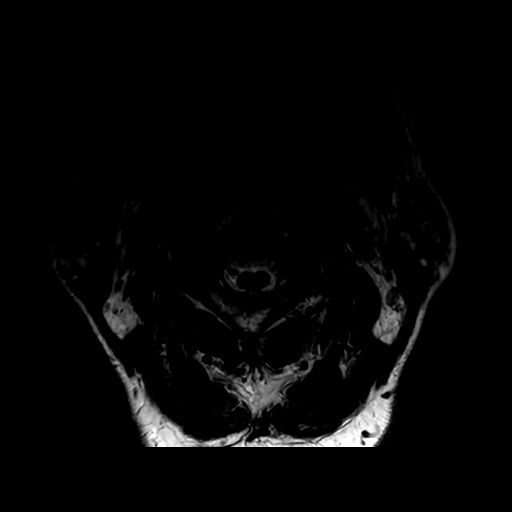

[18 of 48 positions shown; findings below may reference images not displayed]

FINDINGS: MRI HEAD FINDINGS

Brain: No acute infarction, hemorrhage, hydrocephalus, extra-axial
collection or mass lesion. Mild chronic small vessel ischemia in the
hemispheric white matter. Age normal brain volume.

Vascular: Tortuous intracranial vessels, usually a sign of chronic
hypertension. Major flow voids are preserved.

Skull and upper cervical spine: Normal marrow signal.

Sinuses/Orbits: Negative

MRI CERVICAL SPINE FINDINGS

Alignment: Slight anterolisthesis at C4-5 and C7-T1

Vertebrae: No fracture, evidence of discitis, or bone lesion.

Cord: Normal signal and morphology.

Posterior Fossa, vertebral arteries, paraspinal tissues: Negative.

Disc levels:

C2-3: Minor facet spurring and disc bulging

C3-4: Disc narrowing and right eccentric bulging. Asymmetric right
uncovertebral and facet spurring. Moderate right foraminal
impingement

C4-5: Degenerative facet spurring asymmetric to the left. Disc
bulging and uncovertebral ridging. Biforaminal impingement. Mild
spinal stenosis

C5-6: Disc narrowing and bulging with right foraminal protrusion.
Asymmetric right facet spurring. Moderate right foraminal stenosis

C6-7: Disc narrowing and bulging with uncovertebral ridging on the
left more than right. Left more than right foraminal impingement.
Patent spinal canal

C7-T1:Disc narrowing and bulging with degenerative facet spurring
asymmetric to the left. Foraminal narrowing that is mild on the
right and mild-to-moderate on the left.

T1-2: Disc narrowing and circumferential bulging with facet
spurring. Mild to moderate left foraminal narrowing.
IMPRESSION: Brain MRI:

No acute finding.  Negative for infarct.

Cervical spine:

1. Generalized disc and facet degeneration with mild listheses.
2. Right foraminal impingement at C3-4 to C6-7.
3. Left foraminal impingement at C4-5 and especially C6-7.
4. Diffusely patent spinal canal.

## 2022-01-23 IMAGING — CT CT HEAD W/O CM
4 series · 16 of 47 positions shown, 18 images · non-contrast
Comparison: None.

CLINICAL DATA: Stroke-like symptoms



[Series 3: head without · axial · non-contrast · 0.46mm/px · z∈[-65,+65]mm · 7 of 36 slices shown, 9 images]
[im 5/36  brain]
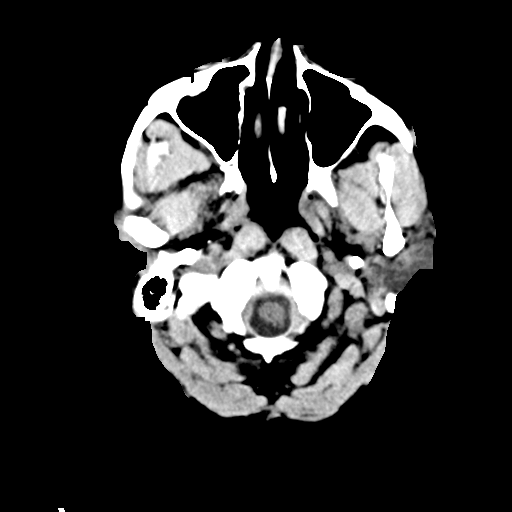
[im 5/36  bone]
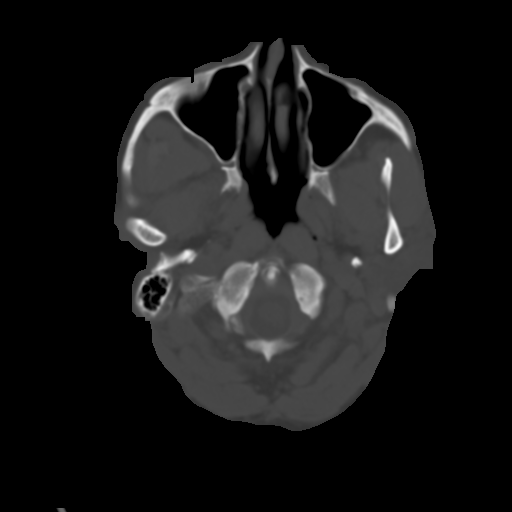
[im 9/36  brain]
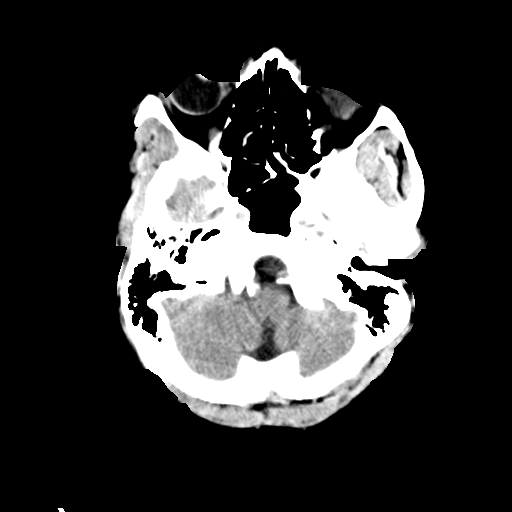
[im 14/36  brain]
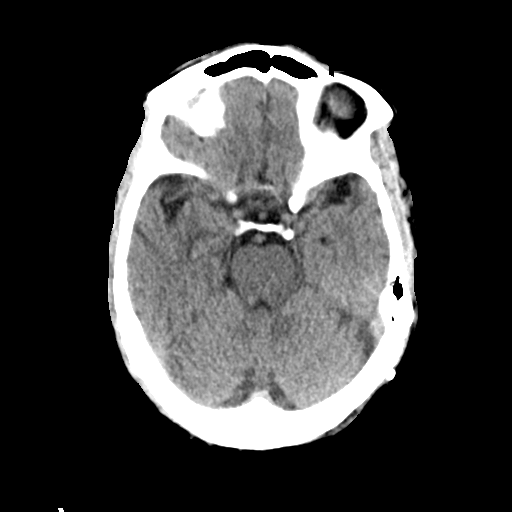
[im 18/36  brain]
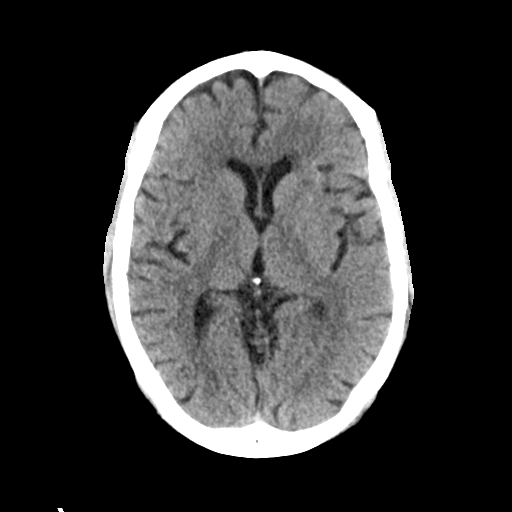
[im 22/36  brain]
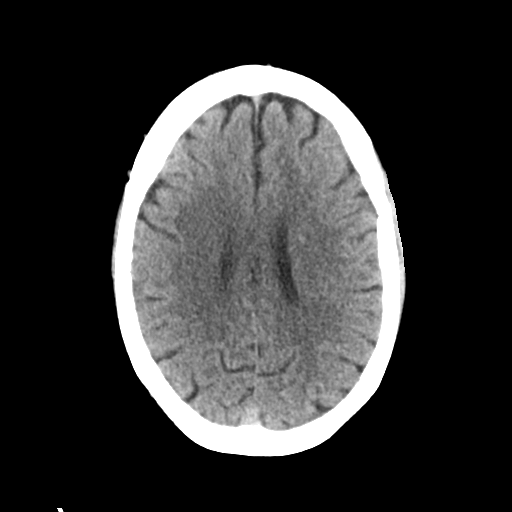
[im 22/36  bone]
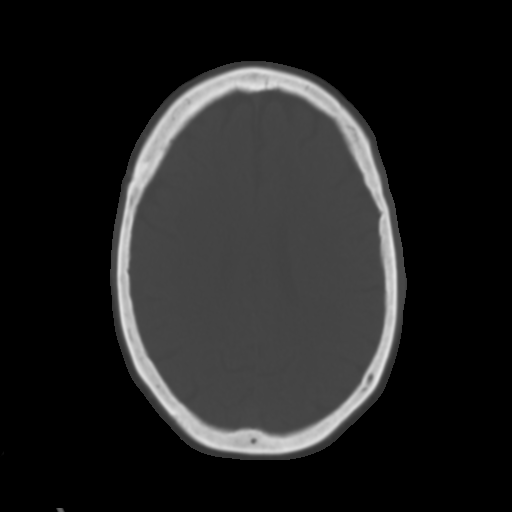
[im 27/36  brain]
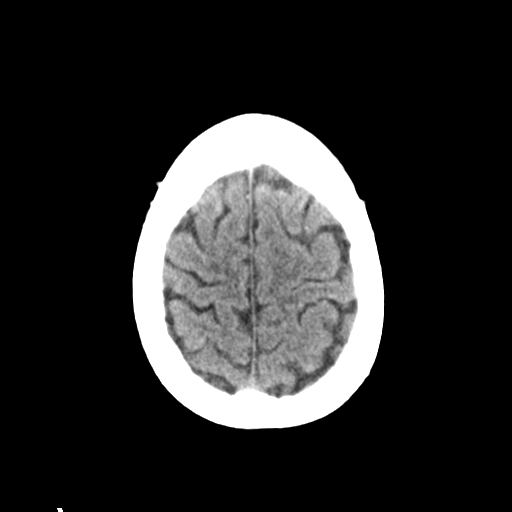
[im 31/36  brain]
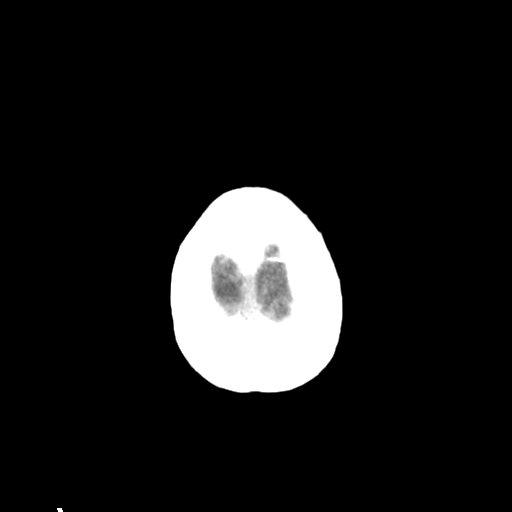

[Series 4: head bone · axial · 0.46mm/px · z∈[-69,-33]mm · 3 of 88 slices shown]
[im 9/88  bone]
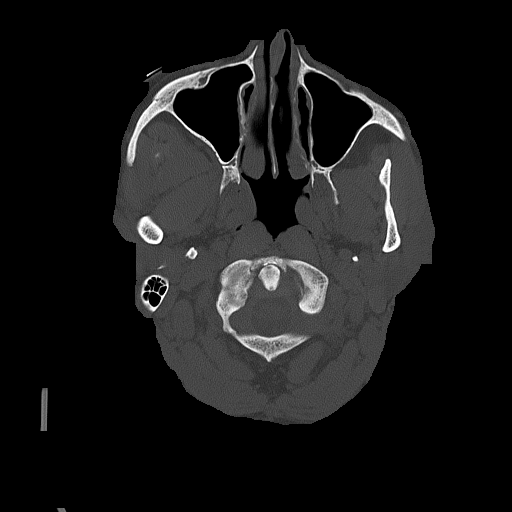
[im 18/88  bone]
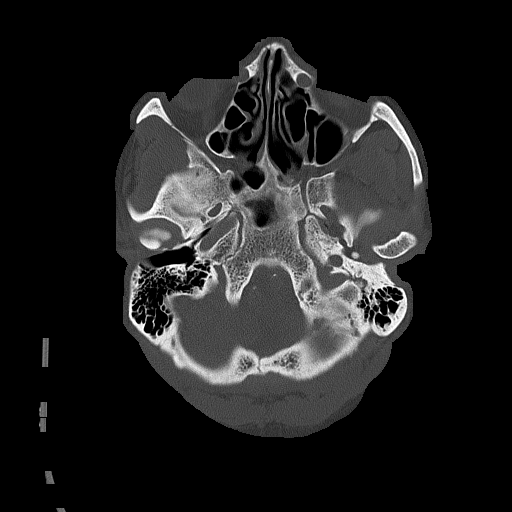
[im 27/88  bone]
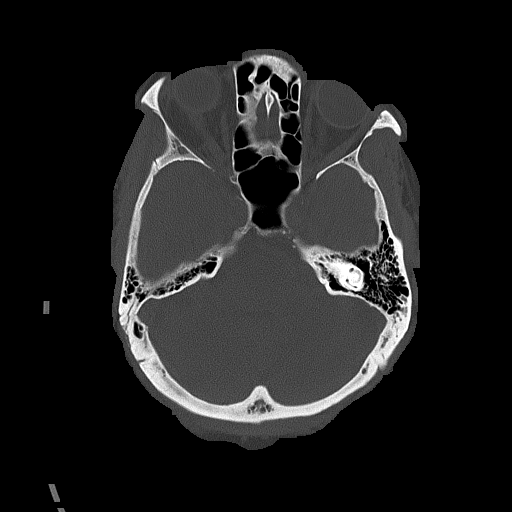

[Series 5: head without cor · coronal · non-contrast · 0.32mm/px · 3 of 69 slices shown]
[im 23/69  brain]
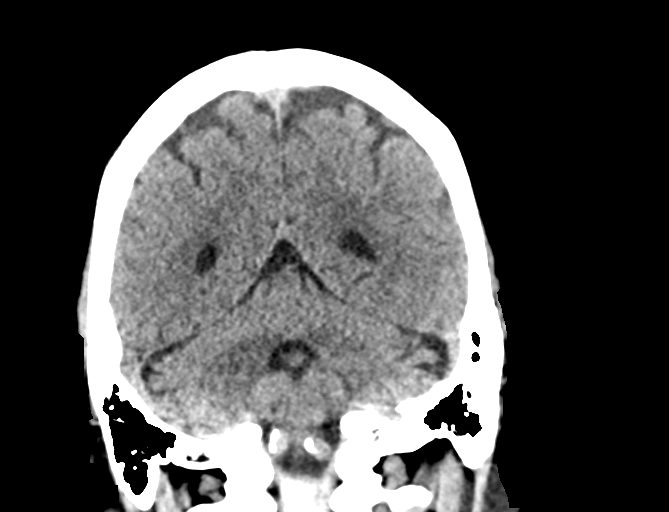
[im 31/69  brain]
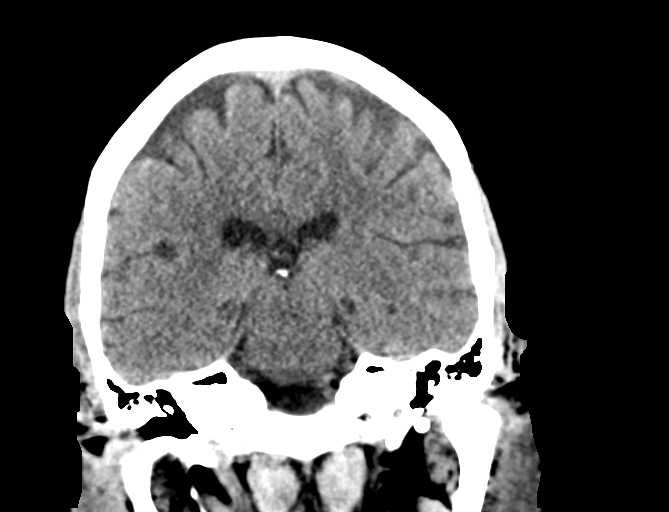
[im 38/69  brain]
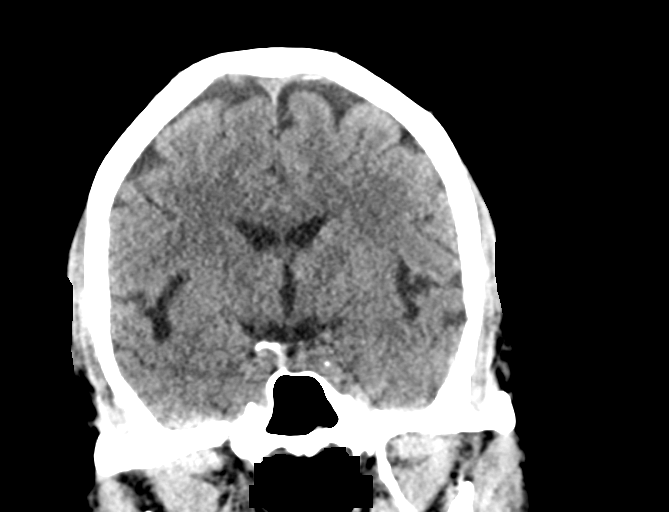

[Series 6: head without sag · sagittal · non-contrast · 0.32mm/px · 3 of 72 slices shown]
[im 24/72  brain]
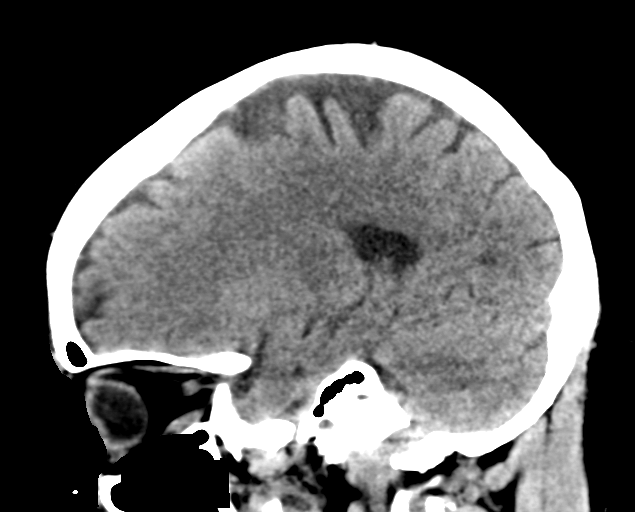
[im 36/72  brain]
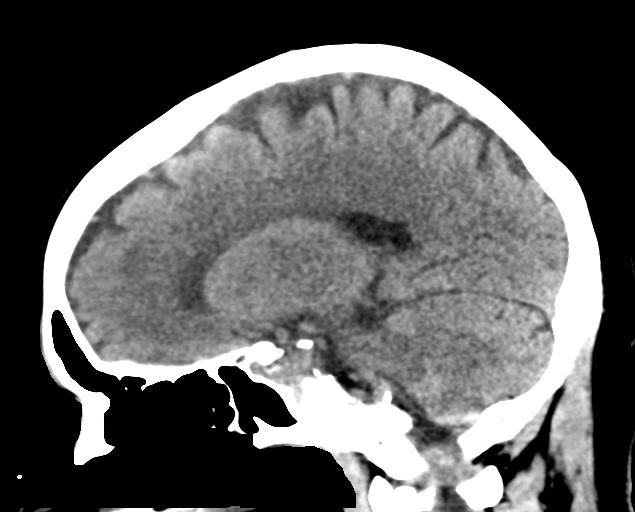
[im 48/72  brain]
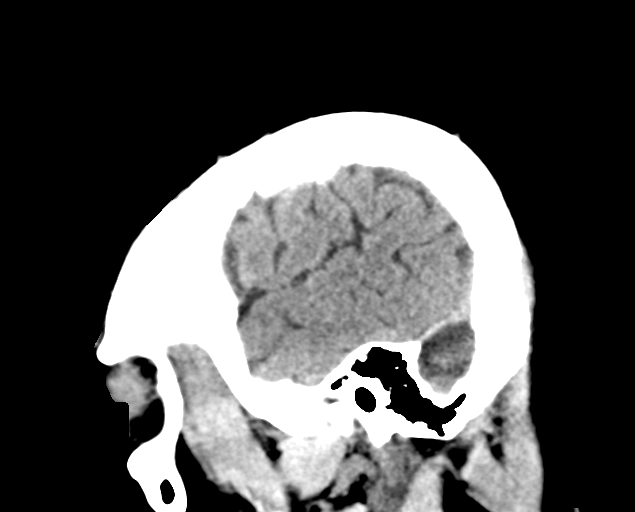

[16 of 47 positions shown; findings below may reference images not displayed]

FINDINGS: Brain: No evidence of acute infarction, hemorrhage, hydrocephalus,
extra-axial collection or mass lesion/mass effect.

Vascular: No hyperdense vessel or unexpected calcification.

Skull: Normal. Negative for fracture or focal lesion.

Sinuses/Orbits: No acute finding.

Other: None.
IMPRESSION: No acute intracranial abnormality noted.

## 2022-01-23 IMAGING — MR MR HEAD W/O CM
6 of 10 series · 28 of 48 positions shown · non-contrast
Comparison: Head CT from earlier today

CLINICAL DATA: Right cervical radiculopathy with 5 days of
persistent left-sided numbness and tingling from neck to leg.

EXAM:
MRI HEAD WITHOUT CONTRAST
MRI CERVICAL SPINE WITHOUT CONTRAST
TECHNIQUE: Multiplanar, multiecho pulse sequences of the brain and surrounding
structures, and cervical spine, to include the craniocervical
junction and cervicothoracic junction, were obtained without
intravenous contrast.

[Series 2: DWI · axial · 3.0mm · 0.94mm/px · z∈[-86,+54]mm · 8 of 93 slices shown (1 of 2)]
[im 1/93]
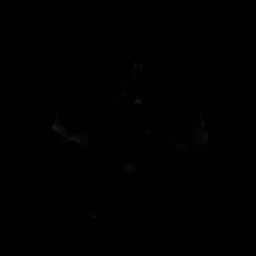
[im 11/93]
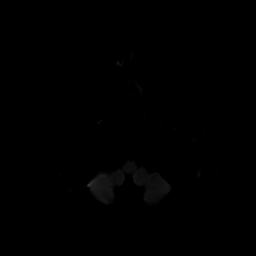
[im 31/93]
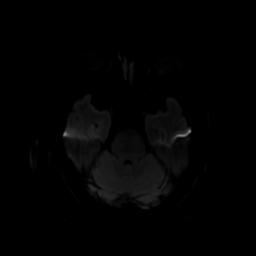
[im 41/93]
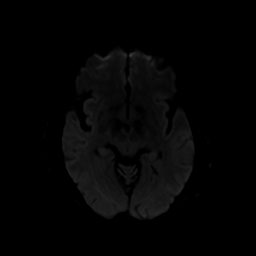
[im 52/93]
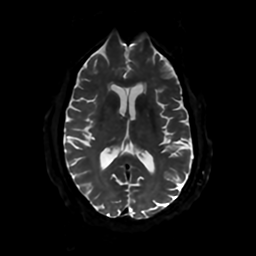
[im 62/93]
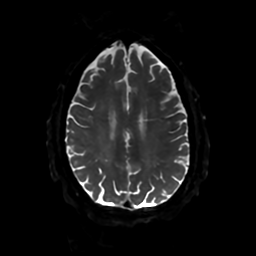
[im 82/93]
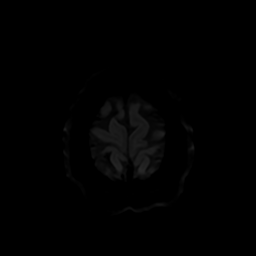
[im 93/93]
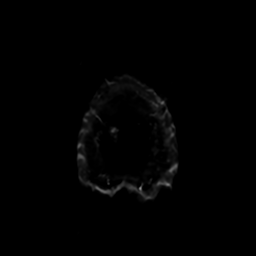

[Series 3: DWI · coronal · 4.0mm · 0.94mm/px · 8 of 70 slices shown (2 of 2)]
[im 1/70]
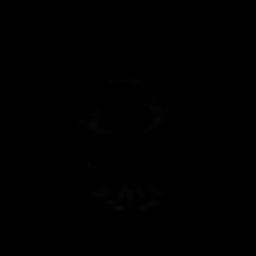
[im 10/70]
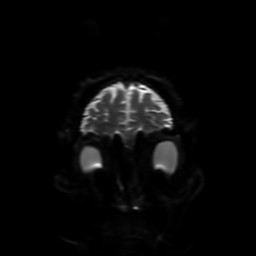
[im 20/70]
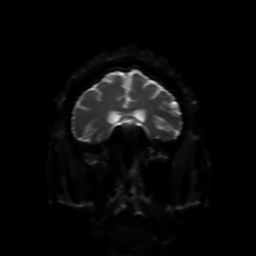
[im 30/70]
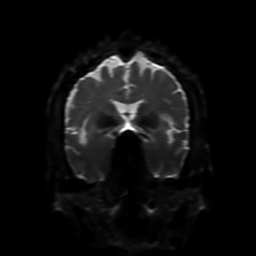
[im 40/70]
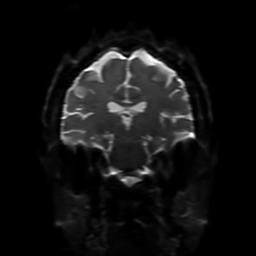
[im 50/70]
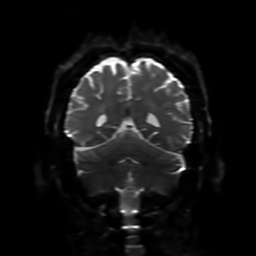
[im 60/70]
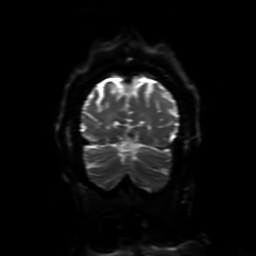
[im 70/70]
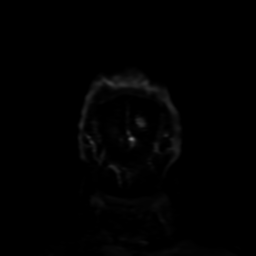

[Series 4: FLAIR · sagittal · 5.0mm · 0.23mm/px · 2 of 23 slices shown (1 of 2)]
[im 1/23]
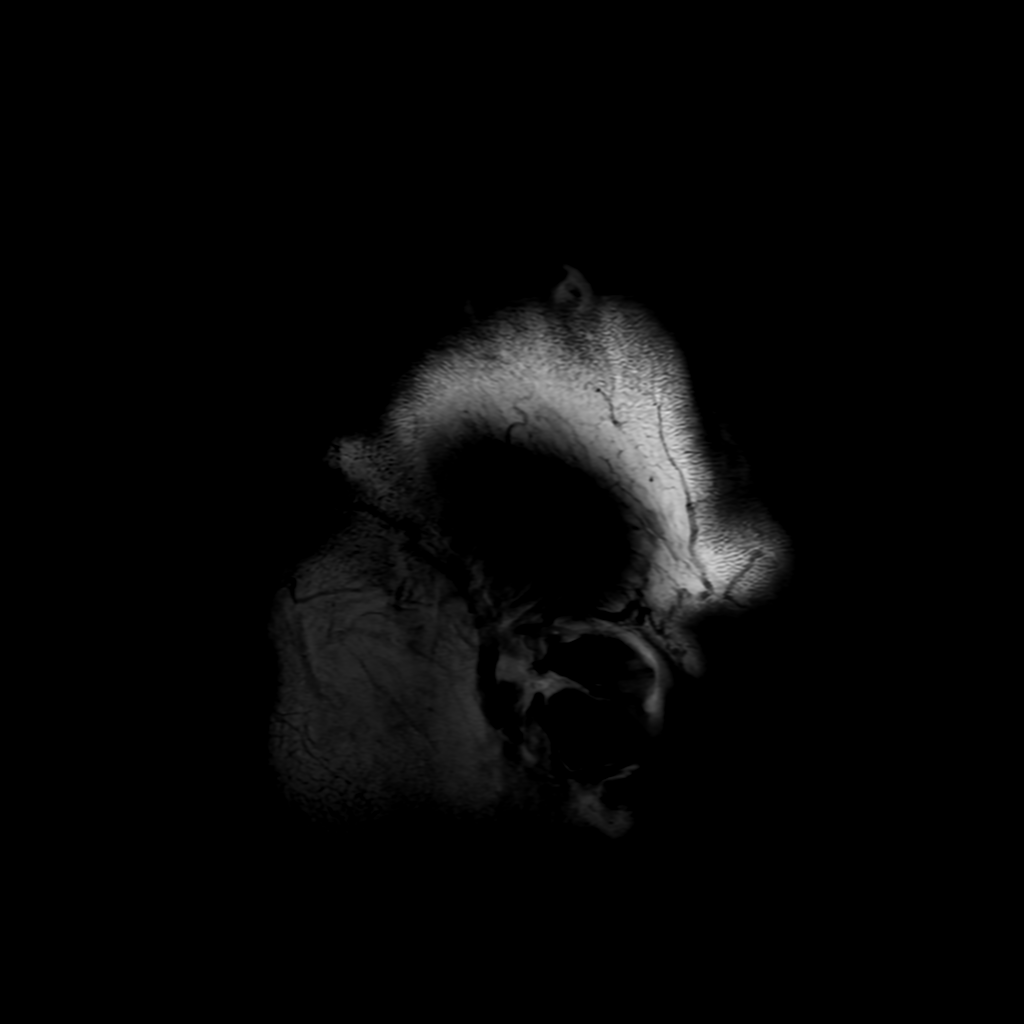
[im 23/23]
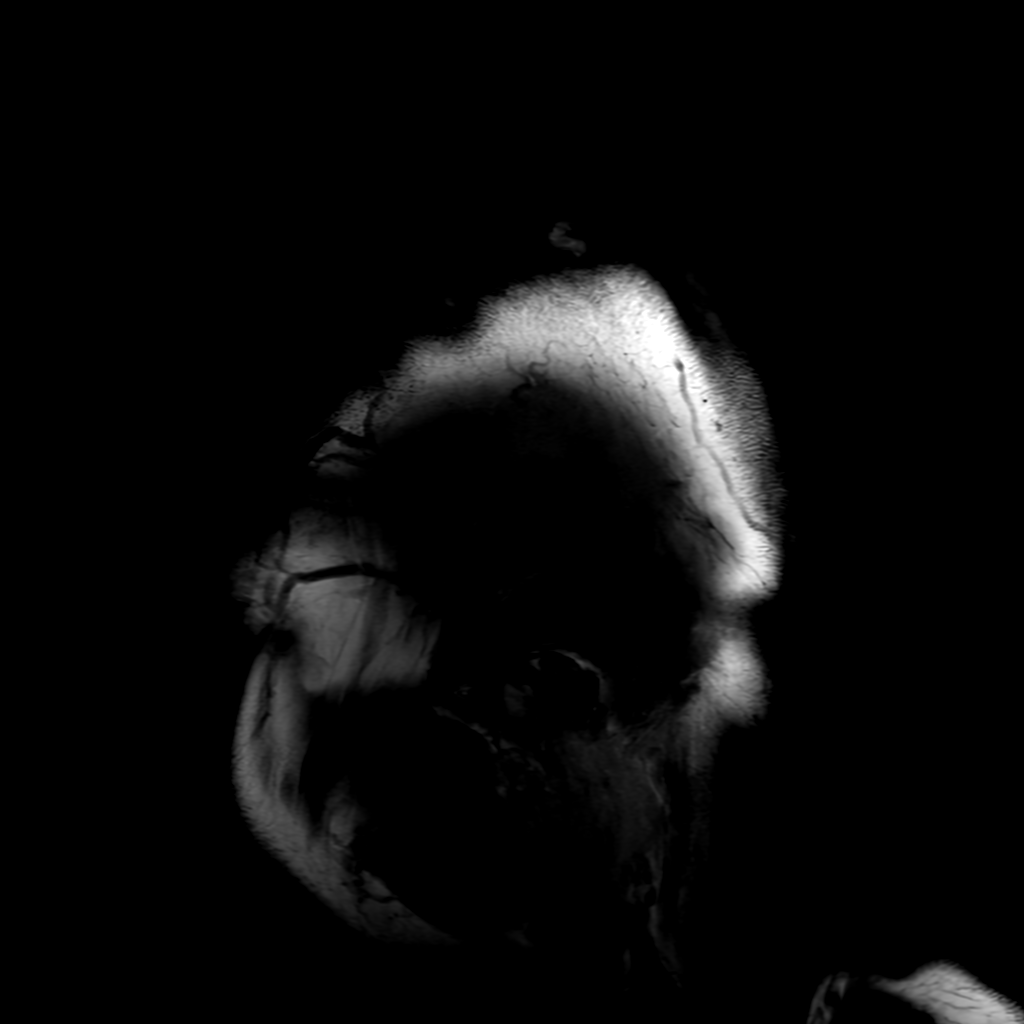

[Series 6: FLAIR · axial · 4.0mm · 0.45mm/px · z∈[-83,+57]mm · 3 of 33 slices shown (2 of 2)]
[im 1/33]
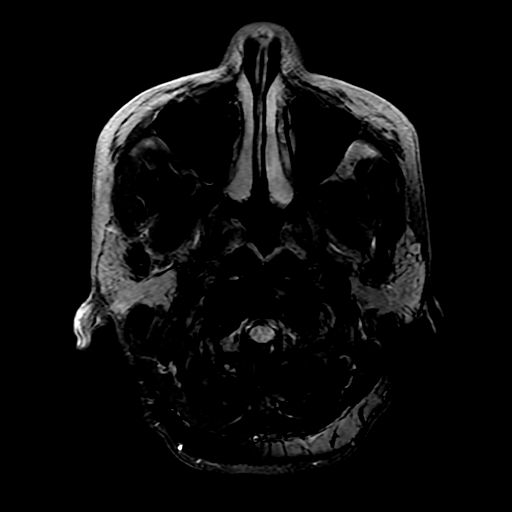
[im 17/33]
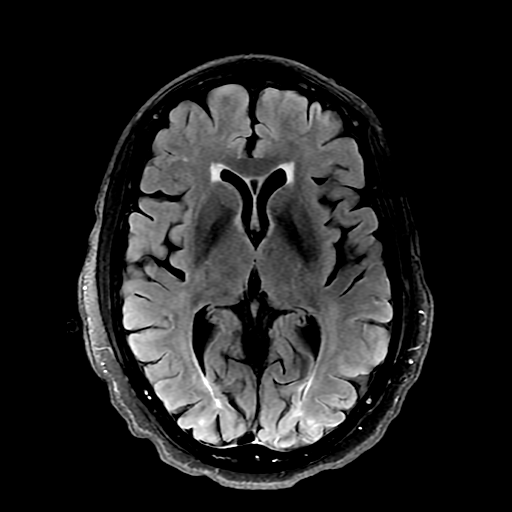
[im 33/33]
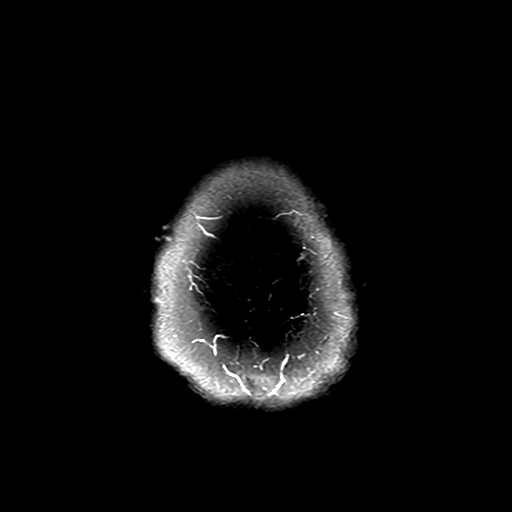

[Series 250: ADC · axial · 3.0mm · 0.94mm/px · z∈[-86,+54]mm · 4 of 45 slices shown (1 of 2)]
[im 1/45]
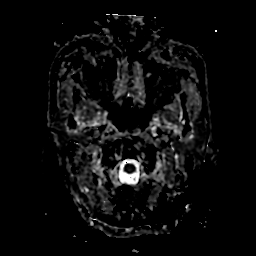
[im 15/45]
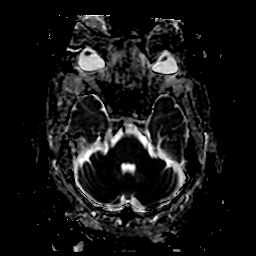
[im 30/45]
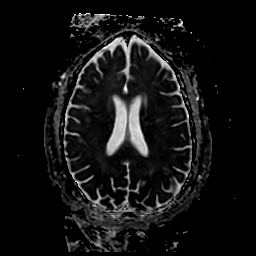
[im 45/45]
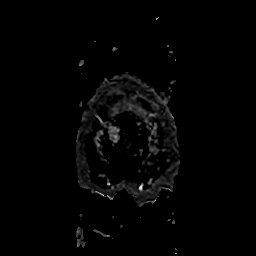

[Series 350: ADC · coronal · 4.0mm · 0.94mm/px · 3 of 35 slices shown (2 of 2)]
[im 1/35]
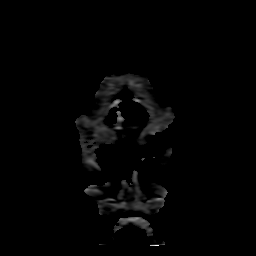
[im 18/35]
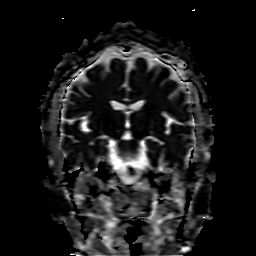
[im 35/35]
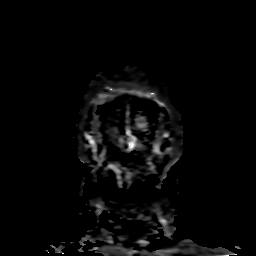

[28 of 48 positions shown; findings below may reference images not displayed]

FINDINGS: MRI HEAD FINDINGS

Brain: No acute infarction, hemorrhage, hydrocephalus, extra-axial
collection or mass lesion. Mild chronic small vessel ischemia in the
hemispheric white matter. Age normal brain volume.

Vascular: Tortuous intracranial vessels, usually a sign of chronic
hypertension. Major flow voids are preserved.

Skull and upper cervical spine: Normal marrow signal.

Sinuses/Orbits: Negative

MRI CERVICAL SPINE FINDINGS

Alignment: Slight anterolisthesis at C4-5 and C7-T1

Vertebrae: No fracture, evidence of discitis, or bone lesion.

Cord: Normal signal and morphology.

Posterior Fossa, vertebral arteries, paraspinal tissues: Negative.

Disc levels:

C2-3: Minor facet spurring and disc bulging

C3-4: Disc narrowing and right eccentric bulging. Asymmetric right
uncovertebral and facet spurring. Moderate right foraminal
impingement

C4-5: Degenerative facet spurring asymmetric to the left. Disc
bulging and uncovertebral ridging. Biforaminal impingement. Mild
spinal stenosis

C5-6: Disc narrowing and bulging with right foraminal protrusion.
Asymmetric right facet spurring. Moderate right foraminal stenosis

C6-7: Disc narrowing and bulging with uncovertebral ridging on the
left more than right. Left more than right foraminal impingement.
Patent spinal canal

C7-T1:Disc narrowing and bulging with degenerative facet spurring
asymmetric to the left. Foraminal narrowing that is mild on the
right and mild-to-moderate on the left.

T1-2: Disc narrowing and circumferential bulging with facet
spurring. Mild to moderate left foraminal narrowing.
IMPRESSION: Brain MRI:

No acute finding.  Negative for infarct.

Cervical spine:

1. Generalized disc and facet degeneration with mild listheses.
2. Right foraminal impingement at C3-4 to C6-7.
3. Left foraminal impingement at C4-5 and especially C6-7.
4. Diffusely patent spinal canal.

## 2022-01-23 MED ORDER — METHYLPREDNISOLONE 4 MG PO TBPK: ORAL_TABLET | ORAL | 0 refills | Status: DC

## 2022-01-23 NOTE — ED Provider Notes (Signed)
?  Physical Exam  ?BP 102/67   Pulse 64   Temp 97.7 ?F (36.5 ?C) (Oral)   Resp 14   Ht 5\' 6"  (1.676 m)   Wt 82.6 kg   SpO2 99%   BMI 29.38 kg/m?  ? ? ? ?Procedures  ?Procedures ? ?ED Course / MDM  ? ?Clinical Course as of 01/23/22 0703  ?Mon Jan 23, 2022  ?Jan 25, 2022 Care of the patient will be signed out at the change of shift pending MRI.  [CS]  ?  ?Clinical Course User Index ?[CS] 5284, MD  ? ?Medical Decision Making ?Amount and/or Complexity of Data Reviewed ?Radiology: ordered. ? ?Risk ?Prescription drug management. ? ? ?52F, 5days of numbness, hx of cervical radiculopathy. MRI pending. ? ? ?MRI Results: ?IMPRESSION:  ?Brain MRI:  ?   ?No acute finding.  Negative for infarct.  ?   ?Cervical spine:  ?   ?1. 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  ?2. Right foraminal impingement at C3-4 to C6-7.  ?3. Left foraminal impingement at C4-5 and especially C6-7.  ?4. Diffusely patent spinal canal.  ? ? ?Discussed the results with the patient.  Did contact on-call neurosurgery to establish follow-up.  Spoke with Dr. Pollyann Savoy who recommended a Medrol Dosepak and follow-up in clinic with plan for further discussion regarding potential surgical correction.  Patient updated regarding plan of care and provided with return precautions in the event of sudden worsening of symptoms.  Stable for discharge. ? ? ? ?  ?Dutch Quint, MD ?01/23/22 1025 ? ?

## 2022-01-23 NOTE — ED Notes (Signed)
Returned from MRI. No signs of distress. No complaints. VSS. Side rails up x 2. Call bell in reach. Spouse at bedside.  ?

## 2022-01-23 NOTE — ED Notes (Signed)
Patient transported to MRI 

## 2022-01-23 NOTE — ED Provider Triage Note (Signed)
Emergency Medicine Provider Triage Evaluation Note ? ?Virginia Larson , a 71 y.o. female  was evaluated in triage.  Pt complains of left sided paresthesias x 6 days. Constant paresthesias/numbness and a times weakness to the left upper/lower extremities. Paresthesias are painful at times. Improved with movement, aggravated when laying down.  ? ?Review of Systems  ?Positive: Paresthesias, numbness, weakness ?Negative: Speech change, dizziness, diplopia ? ?Physical Exam  ?Ht 5\' 6"  (1.676 m)   Wt 82.6 kg   BMI 29.38 kg/m?  ?Gen:   Awake, no distress   ?Resp:  Normal effort  ?MSK:   Moves extremities without difficulty  ?Other:  No facial droop. Sensation intact to light touchx4, 5/5 strength with grip strength and ankle plantar/dorsiflexion. Ambulatory. TTP to the left sided paraspinal muscles.  ? ?Medical Decision Making  ?Medically screening exam initiated at 3:37 AM.  Appropriate orders placed.  Virginia Larson was informed that the remainder of the evaluation will be completed by another provider, this initial triage assessment does not replace that evaluation, and the importance of remaining in the ED until their evaluation is complete. ? ?Numbness. > 48 hours from sxs onset, no code stroke.  ?  Virginia Dyke, PA-C ?01/23/22 [id number] ? ?

## 2022-01-23 NOTE — ED Notes (Signed)
A&O x 4. Ambulatory to bathroom. Gait steady. No complaints. No signs of distress. VSS ?

## 2022-01-23 NOTE — ED Notes (Signed)
Patient remains in MRI 

## 2022-01-23 NOTE — ED Provider Notes (Signed)
? ?[facility]  ?Provider Note ? ?CSN: [id number] ?Arrival date & time: 01/23/22 0310 ? ?History ?Chief Complaint  ?Patient presents with  ? Numbness  ? ? ?Virginia Larson is a 71 y.o. female with prior history of R sided cervical radiculopathy reports about 5 days of persistent L sided numbness/tingling from her neck down her L arm and leg as well as her torso. No weakness but it is painful at times. She reports symptoms seem to be worse when sitting/lying down.  ? ? ?Home Medications ?Prior to Admission medications   ?Medication Sig Start Date End Date Taking? Authorizing Provider  ?albuterol (VENTOLIN HFA) 108 (90 Base) MCG/ACT inhaler Inhale 2 puffs into the lungs every 4 (four) hours as needed for wheezing or shortness of breath. 01/20/22  Yes Lajean Saver, MD  ?atorvastatin (LIPITOR) 10 MG tablet Take 10 mg by mouth at bedtime. 12/28/21  Yes [provider]  ?DM-Doxylamine-Acetaminophen (CORICIDIN HBP NIGHTTIME COLD PO) Take 1 Dose by mouth at bedtime as needed (cold symptoms).   Yes [provider]  ?DM-Doxylamine-Acetaminophen (NYQUIL HBP COLD & FLU PO) Take 1 Dose by mouth at bedtime as needed (cold symptoms).   Yes [provider]  ?glipiZIDE (GLUCOTROL XL) 10 MG 24 hr tablet Take 10 mg by mouth daily. 04/05/21  Yes [provider]  ?HYDROcodone-acetaminophen (NORCO/VICODIN) 5-325 MG tablet Take 1 tablet by mouth every 4 (four) hours as needed for moderate pain. 09/15/21  Yes [provider]  ?lisinopril-hydrochlorothiazide (ZESTORETIC) 20-12.5 MG tablet Take 1 tablet by mouth every morning. 12/17/21  Yes [provider]  ?loratadine (CLARITIN) 10 MG tablet Take 10 mg by mouth daily. 01/16/22  Yes [provider]  ?ondansetron (ZOFRAN ODT) 4 MG disintegrating tablet Take 1 tablet (4 mg total) by mouth every 8 (eight) hours as needed for nausea or vomiting. 03/24/20  Yes Robinson, [place] N, PA-C  ?azithromycin  (ZITHROMAX) 250 MG tablet Take 250 mg by mouth See admin instructions. 500 mg day 1 250 mg days 2-5 ?Patient not taking: Reported on 01/20/2022 01/12/22   [provider]  ?hydrochlorothiazide (MICROZIDE) 12.5 MG capsule Take 1 capsule (12.5 mg total) by mouth daily. Office visit needed for refills ?Patient not taking: Reported on 01/20/2022 06/28/17   Wendie Agreste, MD  ?predniSONE (DELTASONE) 20 MG tablet Take 20 mg by mouth See admin instructions. Qd x 7 days ?Patient not taking: Reported on 01/20/2022 01/12/22   [provider]  ? ? ? ?Allergies    ?Patient has no known allergies. ? ? ?Review of Systems   ?Review of Systems ?Please see HPI for pertinent positives and negatives ? ?Physical Exam ?BP 102/67   Pulse 64   Temp 97.7 ?F (36.5 ?C) (Oral)   Resp 14   Ht 5\' 6"  (1.676 m)   Wt 82.6 kg   SpO2 99%   BMI 29.38 kg/m?  ? ?Physical Exam ?Vitals and nursing note reviewed.  ?Constitutional:   ?   Appearance: Normal appearance.  ?HENT:  ?   Head: Normocephalic and atraumatic.  ?   Nose: Nose normal.  ?   Mouth/Throat:  ?   Mouth: Mucous membranes are moist.  ?Eyes:  ?   Extraocular Movements: Extraocular movements intact.  ?   Conjunctiva/sclera: Conjunctivae normal.  ?Cardiovascular:  ?   Rate and Rhythm: Normal rate.  ?Pulmonary:  ?   Effort: Pulmonary effort is normal.  ?   Breath sounds: Normal breath sounds.  ?Abdominal:  ?  General: Abdomen is flat.  ?   Palpations: Abdomen is soft.  ?   Tenderness: There is no abdominal tenderness.  ?Musculoskeletal:     ?   General: No swelling. Normal range of motion.  ?   Cervical back: Neck supple.  ?Skin: ?   General: Skin is warm and dry.  ?Neurological:  ?   General: No focal deficit present.  ?   Mental Status: She is alert and oriented to person, place, and time.  ?   Cranial Nerves: No cranial nerve deficit.  ?   Sensory: Sensory deficit (subjective decreased sensation L arm and leg) present.  ?   Motor: No weakness.  ?Psychiatric:     ?    Mood and Affect: Mood normal.  ? ? ?ED Results / Procedures / Treatments   ?EKG ?EKG Interpretation ? ?Date/Time:  Monday Jan 23 2022 03:39:26 EDT ?Ventricular Rate:  76 ?PR Interval:  148 ?QRS Duration: 96 ?QT Interval:  402 ?QTC Calculation: 452 ?R Axis:   30 ?Text Interpretation: Normal sinus rhythm Incomplete right bundle branch block Borderline ECG When compared with ECG of 19-Jan-2022 22:43, No significant change since last tracing Confirmed by Calvert Cantor [phone]) on 01/23/2022 5:33:50 AM ? ?Procedures ?Procedures ? ?Medications Ordered in the ED ?Medications - No data to display ? ?Initial Impression and Plan ? Patient here with persistent paresthesia/numbness on the left. Labs done in triage show normal CBC, coags and CK. EtOH is neg. CMP with mildly elevated Cr. I personally viewed the images from radiology studies and agree with radiologist interpretation: CT head neg. Will send for MRI brain/C-spine to eval signs of stroke, demyelinating disease or cervical disk disease.  ? ? ?ED Course  ? ?Clinical Course as of 01/23/22 0644  ?Mon Jan 23, 2022  ?[place] of the patient will be signed out at the change of shift pending MRI.  [CS]  ?  ?Clinical Course User Index ?[CS] Truddie Hidden, MD  ? ? ? ?MDM Rules/Calculators/A&P ?Medical Decision Making ?Amount and/or Complexity of Data Reviewed ?Radiology: ordered. ? ? ? ?Final Clinical Impression(s) / ED Diagnoses ?Final diagnoses:  ?None  ? ? ?Rx / DC Orders ?ED Discharge Orders   ? ? None  ? ?  ? ?  ?Truddie Hidden, MD ?01/23/22 [phone] ? ?

## 2022-01-23 NOTE — ED Triage Notes (Signed)
Pt c/o left sided body numbness with intermittent right sided numbness x 5 days; pt reports numbness subsides with standing; neuro assessment WNL  ?

## 2022-01-23 NOTE — Discharge Instructions (Addendum)
Your MRI results are as follows: ?IMPRESSION:  ?Brain MRI:  ?   ?No acute finding.  Negative for infarct.  ?   ?Cervical spine:  ?   ?1. 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  ?2. Right foraminal impingement at C3-4 to C6-7.  ?3. Left foraminal impingement at C4-5 and especially C6-7.  ?4. Diffusely patent spinal canal.  ? ?The degeneration in your cervical disks warrants follow-up with neurosurgery.  At some point you will likely need a surgical procedure to correct this issue.  Please follow-up with neurosurgery in clinic.  A Medrol Dosepak has been prescribed. ?

## 2022-05-14 ENCOUNTER — Other Ambulatory Visit: Payer: Self-pay

## 2022-05-14 ENCOUNTER — Emergency Department (HOSPITAL_COMMUNITY)
Admission: EM | Admit: 2022-05-14 | Discharge: 2022-05-15 | Discharge status: Against Medical Advice | Payer: Medicare PPO | Attending: Emergency Medicine | Admitting: Emergency Medicine

## 2022-05-14 DIAGNOSIS — M7989 Other specified soft tissue disorders: Secondary | ICD-10-CM | POA: Insufficient documentation

## 2022-05-14 DIAGNOSIS — Z5321 Procedure and treatment not carried out due to patient leaving prior to being seen by health care provider: Secondary | ICD-10-CM | POA: Diagnosis not present

## 2022-05-14 LAB — CBC WITH DIFFERENTIAL/PLATELET
Abs Immature Granulocytes: 0.01 10*3/uL (ref 0.00–0.07)
Basophils Absolute: 0 10*3/uL (ref 0.0–0.1)
Basophils Relative: 0 %
Eosinophils Absolute: 0.3 10*3/uL (ref 0.0–0.5)
Eosinophils Relative: 4 %
HCT: 35.2 % — ABNORMAL LOW (ref 36.0–46.0)
Hemoglobin: 11.3 g/dL — ABNORMAL LOW (ref 12.0–15.0)
Immature Granulocytes: 0 %
Lymphocytes Relative: 41 %
Lymphs Abs: 3.2 10*3/uL (ref 0.7–4.0)
MCH: 31.1 pg (ref 26.0–34.0)
MCHC: 32.1 g/dL (ref 30.0–36.0)
MCV: 97 fL (ref 80.0–100.0)
Monocytes Absolute: 0.7 10*3/uL (ref 0.1–1.0)
Monocytes Relative: 9 %
Neutro Abs: 3.6 10*3/uL (ref 1.7–7.7)
Neutrophils Relative %: 46 %
Platelets: 192 10*3/uL (ref 150–400)
RBC: 3.63 MIL/uL — ABNORMAL LOW (ref 3.87–5.11)
RDW: 13.1 % (ref 11.5–15.5)
WBC: 7.8 10*3/uL (ref 4.0–10.5)
nRBC: 0 % (ref 0.0–0.2)

## 2022-05-14 LAB — BASIC METABOLIC PANEL
Anion gap: 8 (ref 5–15)
BUN: 21 mg/dL (ref 8–23)
CO2: 25 mmol/L (ref 22–32)
Calcium: 9.1 mg/dL (ref 8.9–10.3)
Chloride: 109 mmol/L (ref 98–111)
Creatinine, Ser: 1.42 mg/dL — ABNORMAL HIGH (ref 0.44–1.00)
GFR, Estimated: 39 mL/min — ABNORMAL LOW (ref >60)
Glucose, Bld: 101 mg/dL — ABNORMAL HIGH (ref 70–99)
Potassium: 3.6 mmol/L (ref 3.5–5.1)
Sodium: 142 mmol/L (ref 135–145)

## 2022-05-14 LAB — BRAIN NATRIURETIC PEPTIDE: B Natriuretic Peptide: 32.9 pg/mL (ref 0.0–100.0)

## 2022-05-14 NOTE — ED Triage Notes (Signed)
Pt here for L lower leg and foot swelling. Pt reports she stepped on a nail outside on Friday, pt called PCP and cleaned wound, had a tetanus shot in June, pt's wound has no redness/swelling/drainage. Pt denies hx of chf.

## 2022-05-14 NOTE — ED Provider Triage Note (Signed)
Emergency Medicine Provider Triage Evaluation Note  Virginia Larson , a 71 y.o. female  was evaluated in triage.  Pt complains of left lower extremity swelling.  Reports she stepped on a nail several days ago.  Her tetanus is up-to-date and was updated June or July of this year.  Reports generalized swelling of the left lower extremity.  States she sometimes gets swelling of bilateral lower extremity but never asymmetrical or this bad.  Denies prior history of DVT or PE.  Denies history of CHF.   Review of Systems  Positive: As above Negative: As above  Physical Exam  BP 130/78 (BP Location: Right Arm)   Pulse 83   Temp 98.2 F (36.8 C) (Oral)   Resp 16   SpO2 100%  Gen:   Awake, no distress   Resp:  Normal effort  MSK:   Moves extremities without difficulty  Other:  Left lower extremity swelling present.  Tenderness to palpation.  2+ DP pulse present.  Medical Decision Making  Medically screening exam initiated at 7:38 PM.  Appropriate orders placed.  Virginia Larson was informed that the remainder of the evaluation will be completed by another provider, this initial triage assessment does not replace that evaluation, and the importance of remaining in the ED until their evaluation is complete.     Marita Kansas, PA-C 05/14/22 1940

## 2022-05-15 NOTE — ED Notes (Signed)
X1 for vitals with no response. Not visible in lobby

## 2022-11-01 ENCOUNTER — Other Ambulatory Visit: Payer: Self-pay | Admitting: Family Medicine

## 2022-11-01 DIAGNOSIS — Z1231 Encounter for screening mammogram for malignant neoplasm of breast: Secondary | ICD-10-CM

## 2022-12-18 ENCOUNTER — Ambulatory Visit: Payer: Medicare PPO

## 2023-01-31 ENCOUNTER — Ambulatory Visit
Admission: RE | Admit: 2023-01-31 | Discharge: 2023-01-31 | Disposition: A | Discharge status: Home / Self Care | Payer: Medicare PPO | Source: Ambulatory Visit | Attending: Family Medicine | Admitting: Family Medicine

## 2023-01-31 DIAGNOSIS — Z1231 Encounter for screening mammogram for malignant neoplasm of breast: Secondary | ICD-10-CM

## 2023-05-25 ENCOUNTER — Emergency Department (HOSPITAL_COMMUNITY)
Admission: EM | Admit: 2023-05-25 | Discharge: 2023-05-25 | Disposition: A | Discharge status: Home / Self Care | Payer: Medicare PPO

## 2023-05-25 NOTE — ED Notes (Signed)
Pt here hoping to get an MRI performed originally ordered by her doctor in [place] bc she wants it done at this facility instead of at one in [place]. She does not want to check in as an ED pt & is calling her doctor now to decide if she is staying or leaving. This RN will triage her as soon as she allows me too after she gets off the phone with her doctor.

## 2023-05-25 NOTE — ED Triage Notes (Signed)
The pt came to get an mri  she saw a doctor in [place] [facility] that sent her here for [place] mri although when she arrived she went to xray here thinking that she  had an mri ordered.  No order was on file  back to the xray dept  not an ed pt

## 2023-06-04 ENCOUNTER — Other Ambulatory Visit (HOSPITAL_COMMUNITY): Payer: Self-pay | Admitting: Neurological Surgery

## 2023-06-04 DIAGNOSIS — M519 Unspecified thoracic, thoracolumbar and lumbosacral intervertebral disc disorder: Secondary | ICD-10-CM

## 2023-06-07 ENCOUNTER — Ambulatory Visit (HOSPITAL_COMMUNITY)
Admission: RE | Admit: 2023-06-07 | Discharge: 2023-06-07 | Disposition: A | Discharge status: Home / Self Care | Payer: Medicare PPO | Source: Ambulatory Visit | Attending: Neurological Surgery | Admitting: Neurological Surgery

## 2023-06-07 DIAGNOSIS — M519 Unspecified thoracic, thoracolumbar and lumbosacral intervertebral disc disorder: Secondary | ICD-10-CM | POA: Diagnosis present

## 2023-09-14 ENCOUNTER — Other Ambulatory Visit: Payer: Self-pay | Admitting: Family Medicine

## 2023-09-14 DIAGNOSIS — Z1231 Encounter for screening mammogram for malignant neoplasm of breast: Secondary | ICD-10-CM

## 2024-01-29 ENCOUNTER — Emergency Department (HOSPITAL_COMMUNITY)

## 2024-01-29 ENCOUNTER — Encounter (HOSPITAL_COMMUNITY): Payer: Self-pay | Admitting: Emergency Medicine

## 2024-01-29 ENCOUNTER — Other Ambulatory Visit: Payer: Self-pay

## 2024-01-29 ENCOUNTER — Emergency Department (HOSPITAL_COMMUNITY)
Admission: EM | Admit: 2024-01-29 | Discharge: 2024-01-30 | Discharge status: Against Medical Advice | Attending: Emergency Medicine | Admitting: Emergency Medicine

## 2024-01-29 DIAGNOSIS — R0602 Shortness of breath: Secondary | ICD-10-CM | POA: Diagnosis present

## 2024-01-29 DIAGNOSIS — R062 Wheezing: Secondary | ICD-10-CM | POA: Diagnosis not present

## 2024-01-29 DIAGNOSIS — Z5321 Procedure and treatment not carried out due to patient leaving prior to being seen by health care provider: Secondary | ICD-10-CM | POA: Insufficient documentation

## 2024-01-29 DIAGNOSIS — R110 Nausea: Secondary | ICD-10-CM | POA: Insufficient documentation

## 2024-01-29 LAB — CBC
HCT: 37.7 % (ref 36.0–46.0)
Hemoglobin: 12.1 g/dL (ref 12.0–15.0)
MCH: 30.9 pg (ref 26.0–34.0)
MCHC: 32.1 g/dL (ref 30.0–36.0)
MCV: 96.4 fL (ref 80.0–100.0)
Platelets: 207 10*3/uL (ref 150–400)
RBC: 3.91 MIL/uL (ref 3.87–5.11)
RDW: 13.4 % (ref 11.5–15.5)
WBC: 9.3 10*3/uL (ref 4.0–10.5)
nRBC: 0 % (ref 0.0–0.2)

## 2024-01-29 LAB — BRAIN NATRIURETIC PEPTIDE: B Natriuretic Peptide: 45.3 pg/mL (ref 0.0–100.0)

## 2024-01-29 LAB — BASIC METABOLIC PANEL WITH GFR
Anion gap: 10 (ref 5–15)
BUN: 25 mg/dL — ABNORMAL HIGH (ref 8–23)
CO2: 23 mmol/L (ref 22–32)
Calcium: 9.2 mg/dL (ref 8.9–10.3)
Chloride: 107 mmol/L (ref 98–111)
Creatinine, Ser: 1.26 mg/dL — ABNORMAL HIGH (ref 0.44–1.00)
GFR, Estimated: 45 mL/min — ABNORMAL LOW (ref >60)
Glucose, Bld: 80 mg/dL (ref 70–99)
Potassium: 4 mmol/L (ref 3.5–5.1)
Sodium: 140 mmol/L (ref 135–145)

## 2024-01-29 LAB — TROPONIN I (HIGH SENSITIVITY): Troponin I (High Sensitivity): 3 ng/L (ref <18)

## 2024-01-29 MED ORDER — IPRATROPIUM-ALBUTEROL 0.5-2.5 (3) MG/3ML IN SOLN
3.0000 mL | Freq: Once | RESPIRATORY_TRACT | Status: AC
  Administered 2024-01-29: 3 mL via RESPIRATORY_TRACT
  Filled 2024-01-29: qty 3

## 2024-01-29 NOTE — ED Provider Triage Note (Signed)
 Emergency Medicine Provider Triage Evaluation Note  Virginia Larson , a 73 y.o. female  was evaluated in triage.  Pt complains of shortness of breath. Felt like she had a cold last night, then started having shortness of breath earlier today. No chest pain. Used her albuterol  inhaler with no improvement. Denies history of asthma or COPD. Does not smoke. States her legs are always swollen and painful, she doesn't know why. Did travel out of town to [place] , however that was over a week ago. Denies history of similar symptoms previously.  Review of Systems  Positive:  Negative:   Physical Exam  BP 124/75 (BP Location: Right Arm)   Pulse 78   Temp 98.5 F (36.9 C)   Resp (!) 22   Ht 5\' 6"  (1.676 m)   Wt 83 kg   SpO2 97%   BMI 29.53 kg/m  Gen:   Awake, no distress   Resp:  Normal effort  MSK:   Moves extremities without difficulty  Other:  Initially with difficulty completing sentences, however after a few questions she seems to normalize and is able to complete sentences without difficulty or tachypnea. Lung sounds are clear, no wheezing present.  Medical Decision Making  Medically screening exam initiated at 6:42 PM.  Appropriate orders placed.  FARHIYA VANNATTER was informed that the remainder of the evaluation will be completed by another provider, this initial triage assessment does not replace that evaluation, and the importance of remaining in the ED until their evaluation is complete.     Sherra Dk, PA-C 01/29/24 1845

## 2024-01-29 NOTE — ED Triage Notes (Signed)
 Pt arrives with husband who reports she became SOB about 40 mins ago. Pt used albuterol  inhaler 4 times but feeling nauseous. Cold symptoms started last night. Pt wheezing and having to catch breath while answering triage questions.

## 2024-02-04 ENCOUNTER — Ambulatory Visit
Admission: RE | Admit: 2024-02-04 | Discharge: 2024-02-04 | Disposition: A | Discharge status: Home / Self Care | Payer: Medicare PPO | Source: Ambulatory Visit | Attending: Family Medicine | Admitting: Family Medicine

## 2024-02-04 DIAGNOSIS — Z1231 Encounter for screening mammogram for malignant neoplasm of breast: Secondary | ICD-10-CM

## 2024-05-13 ENCOUNTER — Other Ambulatory Visit (HOSPITAL_COMMUNITY): Payer: Self-pay | Admitting: Family Medicine

## 2024-05-13 DIAGNOSIS — R0989 Other specified symptoms and signs involving the circulatory and respiratory systems: Secondary | ICD-10-CM

## 2024-05-13 DIAGNOSIS — R0602 Shortness of breath: Secondary | ICD-10-CM

## 2024-05-13 DIAGNOSIS — R9389 Abnormal findings on diagnostic imaging of other specified body structures: Secondary | ICD-10-CM

## 2024-05-27 ENCOUNTER — Ambulatory Visit (HOSPITAL_COMMUNITY)
Admission: RE | Admit: 2024-05-27 | Discharge: 2024-05-27 | Disposition: A | Discharge status: Home / Self Care | Source: Ambulatory Visit | Attending: Vascular Surgery | Admitting: Vascular Surgery

## 2024-05-27 DIAGNOSIS — R9389 Abnormal findings on diagnostic imaging of other specified body structures: Secondary | ICD-10-CM | POA: Diagnosis not present

## 2024-05-27 DIAGNOSIS — R0602 Shortness of breath: Secondary | ICD-10-CM | POA: Diagnosis not present

## 2024-05-27 DIAGNOSIS — R0989 Other specified symptoms and signs involving the circulatory and respiratory systems: Secondary | ICD-10-CM | POA: Diagnosis present

## 2024-05-27 LAB — ECHOCARDIOGRAM COMPLETE
AR max vel: 2.9 cm2
AV Area VTI: 2.79 cm2
AV Area mean vel: 2.82 cm2
AV Mean grad: 5 mmHg
AV Peak grad: 9.1 mmHg
Ao pk vel: 1.51 m/s
Area-P 1/2: 3.34 cm2
S' Lateral: 2.71 cm

## 2024-06-06 ENCOUNTER — Ambulatory Visit (HOSPITAL_COMMUNITY): Admission: EM | Admit: 2024-06-06 | Discharge: 2024-06-06 | Disposition: A | Discharge status: Home / Self Care

## 2024-06-06 ENCOUNTER — Encounter (HOSPITAL_COMMUNITY): Payer: Self-pay

## 2024-06-06 ENCOUNTER — Ambulatory Visit (INDEPENDENT_AMBULATORY_CARE_PROVIDER_SITE_OTHER)

## 2024-06-06 DIAGNOSIS — R2231 Localized swelling, mass and lump, right upper limb: Secondary | ICD-10-CM

## 2024-06-06 NOTE — ED Triage Notes (Signed)
 Patient presenting with right anterior forearm swelling. States she was digging in the dirt yesterday when she heard a pop. States only has a history of carpel tunnel in the hands, no other history with the right arm.

## 2024-06-06 NOTE — ED Provider Notes (Signed)
 [facility]  Note:  This document was prepared using Dragon voice recognition software and may include unintentional dictation errors.  MRN: [id number] DOB: July 22, 1951  Subjective:   Virginia Larson is a 73 y.o. female presenting for right upper forearm swelling since yesterday.  Patient reports that she was working in the garden when she felt a pop to the right upper forearm and noticed increased swelling.  Patient reports that she has had some swelling in that area for quite a long time but noticed that swelling is increased and she had mild discomfort.  Patient has past history of carpal tunnel syndrome in the hands but no symptoms to the right upper arm.  Patient denies taking any over-the-counter medication to treat symptoms.  Patient denies any severe pain or difficulty with movement.  No current facility-administered medications for this encounter.  Current Outpatient Medications:    albuterol  (VENTOLIN  HFA) 108 (90 Base) MCG/ACT inhaler, Inhale 2 puffs into the lungs every 4 (four) hours as needed for wheezing or shortness of breath., Disp: 1 each, Rfl: 0   atorvastatin (LIPITOR) 10 MG tablet, Take 10 mg by mouth at bedtime., Disp: , Rfl:    DM-Doxylamine-Acetaminophen (CORICIDIN HBP NIGHTTIME COLD PO), Take 1 Dose by mouth at bedtime as needed (cold symptoms)., Disp: , Rfl:    glipiZIDE (GLUCOTROL XL) 10 MG 24 hr tablet, Take 10 mg by mouth daily., Disp: , Rfl:    hydrochlorothiazide  (MICROZIDE ) 12.5 MG capsule, Take 1 capsule (12.5 mg total) by mouth daily. Office visit needed for refills, Disp: 30 capsule, Rfl: 0   HYDROcodone-acetaminophen (NORCO/VICODIN) 5-325 MG tablet, Take 1 tablet by mouth every 4 (four) hours as needed for moderate pain., Disp: , Rfl:    lisinopril-hydrochlorothiazide  (ZESTORETIC) 20-12.5 MG tablet, Take 1 tablet by mouth every morning., Disp: , Rfl:    loratadine (CLARITIN) 10 MG tablet, Take 10 mg by mouth daily., Disp: ,  Rfl:    No Known Allergies  Past Medical History:  Diagnosis Date   Diabetes (HCC)    Elevated cholesterol    GERD (gastroesophageal reflux disease)    Glucose intolerance (impaired glucose tolerance)    HTN (hypertension)    Hyperlipidemia    Osteoporosis      Past Surgical History:  Procedure Laterality Date   ABDOMINAL HYSTERECTOMY     fibroids.  Ovaries intact.   BREAST EXCISIONAL BIOPSY Left over 10 years ago   benign   BREAST SURGERY     L lump resection; benign.   CYSTECTOMY     on back    Family History  Problem Relation Age of Onset   Heart disease Mother    Dementia Mother    Stroke Mother    Asthma Sister    Hypertension Sister    Colon cancer Neg Hx     Social History   Tobacco Use   Smoking status: Never   Smokeless tobacco: Never  Substance Use Topics   Alcohol use: No   Drug use: No    ROS Refer to HPI for ROS details.  Objective:    Vitals: BP 124/79 (BP Location: Left Arm)   Pulse 82   Temp 98.2 F (36.8 C) (Oral)   Resp 18   Ht 5' 8 (1.727 m)   Wt 175 lb (79.4 kg)   SpO2 97%   BMI 26.61 kg/m   Physical Exam Vitals and nursing note reviewed.  Constitutional:      General: She is not  in acute distress.    Appearance: She is well-developed. She is not ill-appearing or toxic-appearing.  HENT:     Head: Normocephalic and atraumatic.  Cardiovascular:     Rate and Rhythm: Normal rate.  Pulmonary:     Effort: Pulmonary effort is normal. No respiratory distress.  Musculoskeletal:        General: Normal range of motion.     Right forearm: Swelling, deformity and tenderness present. No bony tenderness.  Skin:    General: Skin is warm and dry.  Neurological:     General: No focal deficit present.     Mental Status: She is alert and oriented to person, place, and time.  Psychiatric:        Mood and Affect: Mood normal.        Behavior: Behavior normal.     Procedures  No results found for this or any previous visit  (from the past 24 hours).  Assessment and Plan :     Discharge Instructions       1. Localized swelling of right forearm (Primary) - DG Forearm Right x-ray completed in UC shows no acute fracture, large fatty mass noted to deep soft tissue.  Recommend follow-up with orthopedics for possible need for advanced imaging to assess biceps tendon. - AMB referral to orthopedics for follow-up evaluation of right forearm swelling - Apply arm sling to right arm for comfort and to minimize movement until follow-up with orthopedics -Continue to monitor symptoms for any change in severity if there is any escalation of current symptoms or development of new symptoms follow-up in ER for further evaluation and management.      Virginia Larson B Virginia Larson   Virginia Larson, [name], [place] 06/06/24 1238

## 2024-06-06 NOTE — Discharge Instructions (Addendum)
  1. Localized swelling of right forearm (Primary) - DG Forearm Right x-ray completed in UC shows no acute fracture, large fatty mass noted to deep soft tissue.  Recommend follow-up with orthopedics for possible need for advanced imaging to assess biceps tendon. - AMB referral to orthopedics for follow-up evaluation of right forearm swelling - Apply arm sling to right arm for comfort and to minimize movement until follow-up with orthopedics -Continue to monitor symptoms for any change in severity if there is any escalation of current symptoms or development of new symptoms follow-up in ER for further evaluation and management.

## 2024-08-14 ENCOUNTER — Encounter (HOSPITAL_COMMUNITY): Payer: Self-pay

## 2024-08-14 ENCOUNTER — Ambulatory Visit (HOSPITAL_COMMUNITY)
Admission: EM | Admit: 2024-08-14 | Discharge: 2024-08-14 | Disposition: A | Discharge status: Home / Self Care | Attending: Family Medicine | Admitting: Family Medicine

## 2024-08-14 DIAGNOSIS — M545 Low back pain, unspecified: Secondary | ICD-10-CM | POA: Diagnosis not present

## 2024-08-14 DIAGNOSIS — M79604 Pain in right leg: Secondary | ICD-10-CM | POA: Diagnosis not present

## 2024-08-14 DIAGNOSIS — G8929 Other chronic pain: Secondary | ICD-10-CM | POA: Diagnosis not present

## 2024-08-14 DIAGNOSIS — M79605 Pain in left leg: Secondary | ICD-10-CM | POA: Diagnosis not present

## 2024-08-14 DIAGNOSIS — E11A Type 2 diabetes mellitus without complications in remission: Secondary | ICD-10-CM | POA: Diagnosis not present

## 2024-08-14 LAB — GLUCOSE, POCT (MANUAL RESULT ENTRY): POC Glucose: 137 mg/dL — AB (ref 70–99)

## 2024-08-14 MED ORDER — METHYLPREDNISOLONE SODIUM SUCC 125 MG IJ SOLR
INTRAMUSCULAR | Status: AC
Start: 2024-08-14 — End: 2024-08-14
  Filled 2024-08-14: qty 2

## 2024-08-14 MED ORDER — PREDNISONE 20 MG PO TABS: 40.0000 mg | ORAL_TABLET | Freq: Every day | ORAL | 0 refills | Status: AC

## 2024-08-14 MED ORDER — METHYLPREDNISOLONE SODIUM SUCC 125 MG IJ SOLR
60.0000 mg | Freq: Once | INTRAMUSCULAR | Status: AC
  Administered 2024-08-14: 60 mg via INTRAMUSCULAR

## 2024-08-14 NOTE — ED Triage Notes (Signed)
 Patient reports that she has bilateral lower back pain that radiates down both legs and bilateral knee pain. Patient states the left knee is worse than the right. Patient states it is getting hard for her to step up on steps and the curb.   Patient states she has been taking Hydrocodone for her pain.

## 2024-08-14 NOTE — ED Provider Notes (Signed)
 [facility]    CSN: [id number] Arrival date & time: 08/14/24  1055      History   Chief Complaint Chief Complaint  Patient presents with   Back Pain    HPI Virginia Larson is a 73 y.o. female.    Back Pain  Patient is here for worsening of her chronic low back and leg pain.   H/o lumbar disc disease.  She did  see neurosurgery in October 2024, recommended PT and possible injections, but she never followed up with them.  She is currently having pain from the low back to the lower legs.  The left is worse with right.  At night the pain is worse.  Has pain/numbness in her feet and toes.  She takes hydrocodone prn for pain, may be take 1/2 or 1 before bed to get a good nights sleep.        Past Medical History:  Diagnosis Date   Diabetes (HCC)    Elevated cholesterol    GERD (gastroesophageal reflux disease)    Glucose intolerance (impaired glucose tolerance)    HTN (hypertension)    Hyperlipidemia    Osteoporosis     Patient Active Problem List   Diagnosis Date Noted   Multinodular goiter (nontoxic), dominant nodules left lobe 08/13/2013   Essential hypertension, benign 02/07/2013   Vasomotor instability 02/07/2013   Vitamin D  deficiency 04/03/2012   Hyperlipidemia with target low density lipoprotein (LDL) cholesterol less than 100 mg/dL 92/89/7986   Type II diabetes mellitus, uncontrolled (HCC) 04/03/2012   Obesity (BMI 30.0-34.9) 04/03/2012   DJD (degenerative joint disease) 04/03/2012    Past Surgical History:  Procedure Laterality Date   ABDOMINAL HYSTERECTOMY     fibroids.  Ovaries intact.   BREAST EXCISIONAL BIOPSY Left over 10 years ago   benign   BREAST SURGERY     L lump resection; benign.   CYSTECTOMY     on back    OB History   No obstetric history on file.      Home Medications    Prior to Admission medications   Medication Sig Start Date End Date Taking? Authorizing Provider  albuterol  (VENTOLIN  HFA) 108 (90 Base)  MCG/ACT inhaler Inhale 2 puffs into the lungs every 4 (four) hours as needed for wheezing or shortness of breath. 01/20/22   Steinl, Kevin, MD  atorvastatin (LIPITOR) 10 MG tablet Take 10 mg by mouth at bedtime. 12/28/21   [provider]  DM-Doxylamine-Acetaminophen (CORICIDIN HBP NIGHTTIME COLD PO) Take 1 Dose by mouth at bedtime as needed (cold symptoms). Patient not taking: Reported on 08/14/2024    [provider]  glipiZIDE (GLUCOTROL XL) 10 MG 24 hr tablet Take 10 mg by mouth daily. 04/05/21   [provider]  hydrochlorothiazide  (MICROZIDE ) 12.5 MG capsule Take 1 capsule (12.5 mg total) by mouth daily. Office visit needed for refills 06/28/17   Levora Reyes SAUNDERS, MD  HYDROcodone-acetaminophen (NORCO/VICODIN) 5-325 MG tablet Take 1 tablet by mouth every 4 (four) hours as needed for moderate pain. 09/15/21   [provider]  lisinopril-hydrochlorothiazide  (ZESTORETIC) 20-12.5 MG tablet Take 1 tablet by mouth every morning. 12/17/21   [provider]  loratadine (CLARITIN) 10 MG tablet Take 10 mg by mouth daily. 01/16/22   [provider]    Family History Family History  Problem Relation Age of Onset   Heart disease Mother    Dementia Mother    Stroke Mother    Asthma Sister  Hypertension Sister    Colon cancer Neg Hx     Social History Social History   Tobacco Use   Smoking status: Never   Smokeless tobacco: Never  Vaping Use   Vaping status: Never Used  Substance Use Topics   Alcohol use: No   Drug use: No     Allergies   Patient has no known allergies.   Review of Systems Review of Systems  Constitutional: Negative.   HENT: Negative.    Respiratory: Negative.    Cardiovascular: Negative.   Gastrointestinal: Negative.   Genitourinary: Negative.   Musculoskeletal:  Positive for back pain.     Physical Exam Triage Vital Signs ED Triage Vitals  Encounter Vitals Group     BP 08/14/24 1138 138/79     Girls  Systolic BP Percentile --      Girls Diastolic BP Percentile --      Boys Systolic BP Percentile --      Boys Diastolic BP Percentile --      Pulse Rate 08/14/24 1138 80     Resp 08/14/24 1138 16     Temp 08/14/24 1138 97.9 F (36.6 C)     Temp Source 08/14/24 1138 Oral     SpO2 08/14/24 1138 98 %     Weight --      Height --      Head Circumference --      Peak Flow --      Pain Score 08/14/24 1137 5     Pain Loc --      Pain Education --      Exclude from Growth Chart --    No data found.  Updated Vital Signs BP 138/79 (BP Location: Left Arm)   Pulse 80   Temp 97.9 F (36.6 C) (Oral)   Resp 16   SpO2 98%   Visual Acuity Right Eye Distance:   Left Eye Distance:   Bilateral Distance:    Right Eye Near:   Left Eye Near:    Bilateral Near:     Physical Exam Constitutional:      Appearance: Normal appearance. She is normal weight.  Cardiovascular:     Rate and Rhythm: Normal rate and regular rhythm.  Pulmonary:     Effort: Pulmonary effort is normal.     Breath sounds: Normal breath sounds.  Musculoskeletal:     Comments: No TTP to the spine or low back;  pain with movement of the back and LE;  straight leg raise positive bilaterally  Neurological:     General: No focal deficit present.     Mental Status: She is alert.  Psychiatric:        Mood and Affect: Mood normal.      UC Treatments / Results  Labs (all labs ordered are listed, but only abnormal results are displayed) Labs Reviewed  GLUCOSE, POCT (MANUAL RESULT ENTRY) - Abnormal; Notable for the following components:      Result Value   POC Glucose 137 (*)    All other components within normal limits    EKG   Radiology No results found.  Procedures Procedures (including critical care time)  Medications Ordered in UC Medications  methylPREDNISolone  sodium succinate (SOLU-MEDROL ) 125 mg/2 mL injection 60 mg (has no administration in time range)    Initial Impression / Assessment and  Plan / UC Course  I have reviewed the triage vital signs and the nursing notes.  Pertinent labs & imaging results that were available  during my care of the patient were reviewed by me and considered in my medical decision making (see chart for details).    Final Clinical Impressions(s) / UC Diagnoses   Final diagnoses:  Type 2 diabetes mellitus without complication in remission  Acute on chronic low back pain  Bilateral leg pain     Discharge Instructions      You were seen today for worsening back pain.  I have given you a shot of a steroid today.  I have also sent out an oral steroid that you will start TOMORROW.  These will likely increase your blood sugar so please keep an eye on this.  You may continue the hydrocodone that you have at home.  You should follow up with your neurosurgeon that you saw last October.   Cordella DELENA Rosenthal, DO (Attending) NPI: [id number] [phone] (Work) [phone] (Fax) [address] [place], [place] [address] Neurosurgery    ED Prescriptions     Medication Sig Dispense Auth. Provider   predniSONE  (DELTASONE ) 20 MG tablet Take 2 tablets (40 mg total) by mouth daily for 5 days. 10 tablet Darral Longs, MD      PDMP not reviewed this encounter.   Darral Longs, MD 08/14/24 [phone]

## 2024-08-14 NOTE — Discharge Instructions (Addendum)
 You were seen today for worsening back pain.  I have given you a shot of a steroid today.  I have also sent out an oral steroid that you will start TOMORROW.  These will likely increase your blood sugar so please keep an eye on this.  You may continue the hydrocodone that you have at home.  You should follow up with your neurosurgeon that you saw last October.   Cordella DELENA Rosenthal, DO (Attending) NPI: [id number] (Work) [phone] (Fax) [address] [place], [place] [address] Neurosurgery
